# Patient Record
Sex: Male | Born: 1985 | Race: Black or African American | Hispanic: No | Marital: Single | Smoking: Current every day smoker
Health system: Southern US, Community
[De-identification: ages and names within clinical notes are randomized; demographics above are authoritative.]

## PROBLEM LIST (undated history)

## (undated) DIAGNOSIS — D573 Sickle-cell trait: Secondary | ICD-10-CM

---

## 2008-11-05 ENCOUNTER — Emergency Department: Payer: Self-pay | Admitting: Emergency Medicine

## 2009-02-22 ENCOUNTER — Emergency Department: Payer: Self-pay | Admitting: Emergency Medicine

## 2009-03-24 ENCOUNTER — Emergency Department: Payer: Self-pay | Admitting: Unknown Physician Specialty

## 2015-03-10 ENCOUNTER — Emergency Department
Admission: EM | Admit: 2015-03-10 | Discharge: 2015-03-10 | Disposition: A | Discharge status: Home / Self Care | Payer: No Typology Code available for payment source | Attending: Emergency Medicine | Admitting: Emergency Medicine

## 2015-03-10 ENCOUNTER — Emergency Department: Payer: No Typology Code available for payment source

## 2015-03-10 DIAGNOSIS — S300XXA Contusion of lower back and pelvis, initial encounter: Secondary | ICD-10-CM | POA: Insufficient documentation

## 2015-03-10 DIAGNOSIS — Y9389 Activity, other specified: Secondary | ICD-10-CM | POA: Insufficient documentation

## 2015-03-10 DIAGNOSIS — S3992XA Unspecified injury of lower back, initial encounter: Secondary | ICD-10-CM

## 2015-03-10 DIAGNOSIS — Y92410 Unspecified street and highway as the place of occurrence of the external cause: Secondary | ICD-10-CM | POA: Insufficient documentation

## 2015-03-10 DIAGNOSIS — Y998 Other external cause status: Secondary | ICD-10-CM | POA: Insufficient documentation

## 2015-03-10 MED ORDER — HYDROCODONE-ACETAMINOPHEN 5-325 MG PO TABS: 1.0000 | ORAL_TABLET | ORAL | Status: DC | PRN

## 2015-03-10 MED ORDER — CYCLOBENZAPRINE HCL 10 MG PO TABS: 10.0000 mg | ORAL_TABLET | Freq: Three times a day (TID) | ORAL | Status: DC | PRN

## 2015-03-10 MED ORDER — KETOROLAC TROMETHAMINE 60 MG/2ML IM SOLN
60.0000 mg | Freq: Once | INTRAMUSCULAR | Status: AC
  Administered 2015-03-10: 60 mg via INTRAMUSCULAR
  Filled 2015-03-10: qty 2

## 2015-03-10 MED ORDER — IBUPROFEN 800 MG PO TABS: 800.0000 mg | ORAL_TABLET | Freq: Three times a day (TID) | ORAL | Status: DC | PRN
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
## 2015-03-10 NOTE — Discharge Instructions (Signed)
Iliac Crest Contusion An iliac crest contusion is a deep bruise of your hip bone (hip pointer). Contusions are the result of an injury that caused bleeding under the skin. The contusion may turn blue, purple, or yellow. Minor injuries will give you a painless contusion, but more severe contusions may stay painful and swollen for a few weeks.  CAUSES  An iliac crest contusion is usually caused by a blow to the top of your hip pointer. The injury most often comes from contact sports.  SYMPTOMS   Swelling and redness of the hip area.  Bruising of the hip area.  Tenderness or soreness of the hip. DIAGNOSIS  The diagnosis can be made by taking your history and doing a physical exam. You may need an X-ray of your hip pointer to look for a broken bone (fracture). TREATMENT  Often, the best treatment for an iliac crest contusion is resting, icing, elevating, and applying cold compresses to the injured area. Over-the-counter medicines may also be recommended for pain control. Crutches may be used if walking is very painful. Some people need physical therapy to help with range of motion and strengthening.  HOME CARE INSTRUCTIONS   Put ice on the injured area.  Put ice in a plastic bag.  Place a towel between your skin and the bag.  Leave the ice on for 15-20 minutes, 03-04 times a day.  Only take over-the-counter or prescription medicines for pain, discomfort, or fever as directed by your caregiver. Your caregiver may recommend avoiding anti-inflammatory medicines (aspirin, ibuprofen, and naproxen) for 48 hours because these medicines may increase bruising.  Keep your leg straightened (extended) when possible.  Walk or move around as the pain allows, or as directed by your caregiver. Use crutches if your caregiver recommends them.  Apply compression wraps as directed by your caregiver. You may remove the wraps for sleeping, showers, and baths. SEEK IMMEDIATE MEDICAL CARE IF:   You have  increased bruising or swelling.  You have pain that is getting worse.  Your swelling or pain is not relieved with medicines.  Your toes get cold. MAKE SURE YOU:   Understand these instructions.  Will watch your condition.  Will get help right away if you are not doing well or get worse.   This information is not intended to replace advice given to you by your health care provider. Make sure you discuss any questions you have with your health care provider.   Document Released: 01/18/2001 Document Revised: 07/18/2011 Document Reviewed: 09/10/2014 Elsevier Interactive Patient Education 2016 [employer]  Contusion A contusion is a deep bruise. Contusions are the result of a blunt injury to tissues and muscle fibers under the skin. The injury causes bleeding under the skin. The skin overlying the contusion may turn blue, purple, or yellow. Minor injuries will give you a painless contusion, but more severe contusions may stay painful and swollen for a few weeks.  CAUSES  This condition is usually caused by a blow, trauma, or direct force to an area of the body. SYMPTOMS  Symptoms of this condition include:  Swelling of the injured area.  Pain and tenderness in the injured area.  Discoloration. The area may have redness and then turn blue, purple, or yellow. DIAGNOSIS  This condition is diagnosed based on a physical exam and medical history. An X-ray, CT scan, or MRI may be needed to determine if there are any associated injuries, such as broken bones (fractures). TREATMENT  Specific treatment for this condition  depends on what area of the body was injured. In general, the best treatment for a contusion is resting, icing, applying pressure to (compression), and elevating the injured area. This is often called the RICE strategy. Over-the-counter anti-inflammatory medicines may also be recommended for pain control.  HOME CARE INSTRUCTIONS   Rest the injured area.  If directed,  apply ice to the injured area:  Put ice in a plastic bag.  Place a towel between your skin and the bag.  Leave the ice on for 20 minutes, 2-3 times per day.  If directed, apply light compression to the injured area using an elastic bandage. Make sure the bandage is not wrapped too tightly. Remove and reapply the bandage as directed by your health care provider.  If possible, raise (elevate) the injured area above the level of your heart while you are sitting or lying down.  Take over-the-counter and prescription medicines only as told by your health care provider. SEEK MEDICAL CARE IF:  Your symptoms do not improve after several days of treatment.  Your symptoms get worse.  You have difficulty moving the injured area. SEEK IMMEDIATE MEDICAL CARE IF:   You have severe pain.  You have numbness in a hand or foot.  Your hand or foot turns pale or cold.   This information is not intended to replace advice given to you by your health care provider. Make sure you discuss any questions you have with your health care provider.   Document Released: 02/02/2005 Document Revised: 01/14/2015 Document Reviewed: 09/10/2014 Elsevier Interactive Patient Education [employer] Else[employer] Inc[employer]
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
## 2020-03-01 ENCOUNTER — Other Ambulatory Visit: Payer: Self-pay

## 2020-03-01 ENCOUNTER — Emergency Department: Payer: Self-pay

## 2020-03-01 ENCOUNTER — Encounter: Payer: Self-pay | Admitting: Emergency Medicine

## 2020-03-01 ENCOUNTER — Emergency Department
Admission: EM | Admit: 2020-03-01 | Discharge: 2020-03-01 | Disposition: A | Discharge status: Home / Self Care | Payer: Self-pay | Attending: Emergency Medicine | Admitting: Emergency Medicine

## 2020-03-01 DIAGNOSIS — M79671 Pain in right foot: Secondary | ICD-10-CM | POA: Insufficient documentation

## 2020-03-01 MED ORDER — NAPROXEN 500 MG PO TABS: 500.0000 mg | ORAL_TABLET | Freq: Two times a day (BID) | ORAL | 0 refills | Status: DC

## 2020-03-01 MED ORDER — HYDROCODONE-ACETAMINOPHEN 5-325 MG PO TABS: 1.0000 | ORAL_TABLET | Freq: Four times a day (QID) | ORAL | 0 refills | Status: DC | PRN

## 2020-03-01 MED ORDER — KETOROLAC TROMETHAMINE 30 MG/ML IJ SOLN
30.0000 mg | Freq: Once | INTRAMUSCULAR | Status: AC
  Administered 2020-03-01: 30 mg via INTRAMUSCULAR
  Filled 2020-03-01: qty 1

## 2020-03-01 MED ORDER — CEPHALEXIN 500 MG PO CAPS: 500.0000 mg | ORAL_CAPSULE | Freq: Three times a day (TID) | ORAL | 0 refills | Status: DC

## 2020-03-01 NOTE — ED Provider Notes (Signed)
[facility] Emergency Department Provider Note   ____________________________________________   First MD Initiated Contact with Patient 03/01/20 [phone]     (approximate)  I have reviewed the triage vital signs and the nursing notes.   HISTORY  Chief Complaint Foot Pain    HPI Dalton Hernandez is a 34 y.o. male presents to the ED with complaint of right foot pain. Patient states that he woke Saturday with pain on the dorsal aspect of his right foot without known history of injury. Patient states that he works at [employer] and stands most of the day. He reports that he wears supportive shoes. He denies any previous foot injury. He has not taken any over-the-counter medication for his foot. He rates his pain as 10/10.      History reviewed. No pertinent past medical history.  There are no problems to display for this patient.   History reviewed. No pertinent surgical history.  Prior to Admission medications   Medication Sig Start Date End Date Taking? Authorizing Provider  cephALEXin (KEFLEX) 500 MG capsule Take 1 capsule (500 mg total) by mouth 3 (three) times daily. 03/01/20   Tommi Rumps, PA-C  HYDROcodone-acetaminophen (NORCO/VICODIN) 5-325 MG tablet Take 1 tablet by mouth every 6 (six) hours as needed for moderate pain. 03/01/20   Tommi Rumps, PA-C  naproxen (NAPROSYN) 500 MG tablet Take 1 tablet (500 mg total) by mouth 2 (two) times daily with a meal. 03/01/20   Tommi Rumps, PA-C    Allergies Patient has no known allergies.  No family history on file.  Social History Social History   Tobacco Use  . Smoking status: Never Smoker  Substance Use Topics  . Alcohol use: No  . Drug use: Not on file    Review of Systems Constitutional: No fever/chills Eyes: No visual changes. Cardiovascular: Denies chest pain. Respiratory: Denies shortness of breath. Gastrointestinal:   No nausea, no vomiting.   Musculoskeletal:  Positive for right foot pain. Skin: Negative for rash. Neurological: Negative for headaches, focal weakness or numbness. ____________________________________________   PHYSICAL EXAM:  VITAL SIGNS: ED Triage Vitals  Enc Vitals Group     BP 03/01/20 0840 129/88     Pulse Rate 03/01/20 0840 60     Resp 03/01/20 0840 17     Temp 03/01/20 0840 99 F (37.2 C)     Temp Source 03/01/20 0840 Oral     SpO2 03/01/20 0840 97 %     Weight 03/01/20 0837 225 lb (102.1 kg)     Height 03/01/20 0837 6\' 3"  (1.905 m)     Head Circumference --      Peak Flow --      Pain Score 03/01/20 0837 10     Pain Loc --      Pain Edu? --      Excl. in GC? --     Constitutional: Alert and oriented. Well appearing and in no acute distress. Eyes: Conjunctivae are normal.  Head: Atraumatic. Neck: No stridor.   Cardiovascular: Normal rate, regular rhythm. Grossly normal heart sounds.  Good peripheral circulation. Respiratory: Normal respiratory effort.  No retractions. Lungs CTAB. Musculoskeletal: Examination of the right foot there is no gross deformity.  Patient has what looks to be an erythematous area measuring approximately 4 cm in diameter.  This area is not warm to touch but is markedly tender and patient is unable to tolerate palpation.  Skin is intact.  Pulses present both DP and  TP.  Motor sensory function intact.  There is no tenderness on palpation of the bilateral malleolus. Neurologic:  Normal speech and language. No gross focal neurologic deficits are appreciated.  Skin:  Skin is warm, dry and intact.  Psychiatric: Mood and affect are normal. Speech and behavior are normal.  ____________________________________________   LABS (all labs ordered are listed, but only abnormal results are displayed)  Labs Reviewed - No data to display   RADIOLOGY I, Tommi Rumps, personally viewed and evaluated these images (plain radiographs) as part of my medical decision making, as well as reviewing the  written report by the radiologist.   Official radiology report(s): DG Foot Complete Right  Result Date: 03/01/2020 CLINICAL DATA:  RIGHT foot pain for 3 days. EXAM: RIGHT FOOT COMPLETE - 3+ VIEW COMPARISON:  None. FINDINGS: Osseous alignment is normal. Bone mineralization is normal. No fracture line or displaced fracture fragment. No acute appearing cortical irregularity or osseous lesion. No degenerative change. No erosions, periarticular osteopenia or other signs of an inflammatory arthritis. Adjacent soft tissues are unremarkable. IMPRESSION: Negative. Electronically Signed   By: Bary Richard M.D.   On: 03/01/2020 09:27    ____________________________________________   PROCEDURES  Procedure(s) performed (including Critical Care):  Procedures   ____________________________________________   INITIAL IMPRESSION / ASSESSMENT AND PLAN / ED COURSE  As part of my medical decision making, I reviewed the following data within the electronic MEDICAL RECORD NUMBER Notes from prior ED visits and [place] Controlled Substance Database  34 year old male presents to the ED with complaint of right foot pain without history of injury.  Patient states that he stands a lot at work and this was sudden onset when he woke up yesterday morning.  Patient does not take any over-the-counter medication.  X-ray was negative for any acute bony injury.  There is a questionable early cellulitis versus ecchymosis on the dorsal aspect of his right foot measuring approximately 4 cm in diameter.  Skin is intact.  Patient was given Toradol 30 mg IM while in the ED.  A prescription for Keflex 500 mg, naproxen 500 mg and hydrocodone was sent to the pharmacy.  Patient is aware that he needs to elevate his foot today and continue with medications.  He is to return to the emergency department if any severe worsening of his symptoms or urgent concerns.  A note was written for him to stay out of work the next 2  days.  ____________________________________________   FINAL CLINICAL IMPRESSION(S) / ED DIAGNOSES  Final diagnoses:  Foot pain, right     ED Discharge Orders         Ordered    cephALEXin (KEFLEX) 500 MG capsule  3 times daily        03/01/20 1019    naproxen (NAPROSYN) 500 MG tablet  2 times daily with meals        03/01/20 1019    HYDROcodone-acetaminophen (NORCO/VICODIN) 5-325 MG tablet  Every 6 hours PRN        03/01/20 1019          *Please note:  Dalton Hernandez was evaluated in Emergency Department on 03/01/2020 for the symptoms described in the history of present illness. He was evaluated in the context of the global COVID-19 pandemic, which necessitated consideration that the patient might be at risk for infection with the SARS-CoV-2 virus that causes COVID-19. Institutional protocols and algorithms that pertain to the evaluation of patients at risk for COVID-19 are in a state  of rapid change based on information released by regulatory bodies including the CDC and federal and state organizations. These policies and algorithms were followed during the patient's care in the ED.  Some ED evaluations and interventions may be delayed as a result of limited staffing during and the pandemic.*   Note:  This document was prepared using Dragon voice recognition software and may include unintentional dictation errors.    Tommi Rumps, PA-C 03/01/20 1137    Minna Antis, MD 03/01/20 [phone]

## 2020-03-01 NOTE — Discharge Instructions (Signed)
Follow up with [facility] acute care or Dr. Alberteen Spindle if any continued problems.  Elevate your foot Begin taking your antibiotics, naprosyn and Norco if needed for pain.  Out of work note written.  Return to ED if any severe worsening of your foot such as fever, chills or change in temperature of your foot

## 2020-03-01 NOTE — ED Notes (Signed)
Pt presents to the ED for L foot pain and swelling. Denies injury pt states that he working 60hours a week. Pt is A&Ox4 and NAD. Ambulatory with a cane. Pt requesting ace bandages for the foot.

## 2020-03-01 NOTE — ED Triage Notes (Signed)
Pt reports woke up Saturday and he had pain in his right foot. Pt denies injuries but reports that the pain is on the top of his foot.

## 2020-05-23 ENCOUNTER — Other Ambulatory Visit: Payer: Self-pay

## 2020-09-04 ENCOUNTER — Other Ambulatory Visit: Payer: Self-pay

## 2020-09-04 ENCOUNTER — Encounter: Payer: Self-pay | Admitting: Emergency Medicine

## 2020-09-04 ENCOUNTER — Emergency Department: Payer: Self-pay

## 2020-09-04 ENCOUNTER — Emergency Department
Admission: EM | Admit: 2020-09-04 | Discharge: 2020-09-04 | Disposition: A | Discharge status: Home / Self Care | Payer: Self-pay | Attending: Emergency Medicine | Admitting: Emergency Medicine

## 2020-09-04 DIAGNOSIS — M19072 Primary osteoarthritis, left ankle and foot: Secondary | ICD-10-CM | POA: Insufficient documentation

## 2020-09-04 DIAGNOSIS — E790 Hyperuricemia without signs of inflammatory arthritis and tophaceous disease: Secondary | ICD-10-CM | POA: Insufficient documentation

## 2020-09-04 DIAGNOSIS — M79671 Pain in right foot: Secondary | ICD-10-CM | POA: Insufficient documentation

## 2020-09-04 DIAGNOSIS — F17290 Nicotine dependence, other tobacco product, uncomplicated: Secondary | ICD-10-CM | POA: Insufficient documentation

## 2020-09-04 HISTORY — DX: Sickle-cell trait: D57.3

## 2020-09-04 LAB — BASIC METABOLIC PANEL
Anion gap: 10 (ref 5–15)
BUN: 14 mg/dL (ref 6–20)
CO2: 24 mmol/L (ref 22–32)
Calcium: 9.5 mg/dL (ref 8.9–10.3)
Chloride: 103 mmol/L (ref 98–111)
Creatinine, Ser: 1.03 mg/dL (ref 0.61–1.24)
GFR, Estimated: >60 mL/min (ref >60)
Glucose, Bld: 103 mg/dL — ABNORMAL HIGH (ref 70–99)
Potassium: 4.1 mmol/L (ref 3.5–5.1)
Sodium: 137 mmol/L (ref 135–145)

## 2020-09-04 LAB — CBC WITH DIFFERENTIAL/PLATELET
Abs Immature Granulocytes: 0.02 10*3/uL (ref 0.00–0.07)
Basophils Absolute: 0 10*3/uL (ref 0.0–0.1)
Basophils Relative: 1 %
Eosinophils Absolute: 0.3 10*3/uL (ref 0.0–0.5)
Eosinophils Relative: 3 %
HCT: 42.4 % (ref 39.0–52.0)
Hemoglobin: 15 g/dL (ref 13.0–17.0)
Immature Granulocytes: 0 %
Lymphocytes Relative: 23 %
Lymphs Abs: 1.8 10*3/uL (ref 0.7–4.0)
MCH: 31.6 pg (ref 26.0–34.0)
MCHC: 35.4 g/dL (ref 30.0–36.0)
MCV: 89.3 fL (ref 80.0–100.0)
Monocytes Absolute: 0.8 10*3/uL (ref 0.1–1.0)
Monocytes Relative: 10 %
Neutro Abs: 5.1 10*3/uL (ref 1.7–7.7)
Neutrophils Relative %: 63 %
Platelets: 237 10*3/uL (ref 150–400)
RBC: 4.75 MIL/uL (ref 4.22–5.81)
RDW: 11.7 % (ref 11.5–15.5)
WBC: 7.9 10*3/uL (ref 4.0–10.5)
nRBC: 0 % (ref 0.0–0.2)

## 2020-09-04 LAB — URIC ACID: Uric Acid, Serum: 9 mg/dL — ABNORMAL HIGH (ref 3.7–8.6)

## 2020-09-04 NOTE — ED Provider Notes (Signed)
[facility] Emergency Department Provider Note   ____________________________________________   Event Date/Time   First MD Initiated Contact with Patient 09/04/20 [phone]     (approximate)  I have reviewed the triage vital signs and the nursing notes.   HISTORY  Chief Complaint Foot Pain   HPI Dalton Hernandez is a 35 y.o. male presents to the ED with complaint of bilateral foot pain.  Patient states that he thinks he may have gout.  He denies any recent injury to either foot.  His pain to the left foot is more on the lateral aspect.  He states pain started yesterday and he has taken over-the-counter medication without any relief.  He was seen for similar symptoms last month with his right foot.       Past Medical History:  Diagnosis Date  . Sickle cell trait (HCC)     There are no problems to display for this patient.   History reviewed. No pertinent surgical history.  Prior to Admission medications   Not on File    Allergies Patient has no known allergies.  No family history on file.  Social History Social History   Tobacco Use  . Smoking status: Current Every Day Smoker    Types: Cigars  . Smokeless tobacco: Never Used  Substance Use Topics  . Alcohol use: Yes  . Drug use: Not Currently    Review of Systems Constitutional: No fever/chills Eyes: No visual changes. Cardiovascular: Denies chest pain. Respiratory: Denies shortness of breath. Gastrointestinal: No abdominal pain.  No nausea, no vomiting.  Genitourinary: Negative for dysuria. Musculoskeletal: Positive for bilateral foot pain.  Negative for back pain. Skin: Negative for rash. Neurological: Negative for headaches, focal weakness or numbness.  ____________________________________________   PHYSICAL EXAM:  VITAL SIGNS: ED Triage Vitals  Enc Vitals Group     BP 09/04/20 0917 (!) 134/94     Pulse Rate 09/04/20 0914 78     Resp 09/04/20 0914 16     Temp 09/04/20  0914 98.5 F (36.9 C)     Temp Source 09/04/20 0914 Oral     SpO2 09/04/20 0914 97 %     Weight 09/04/20 0911 216 lb (98 kg)     Height 09/04/20 0911 6\' 3"  (1.905 m)     Head Circumference --      Peak Flow --      Pain Score 09/04/20 0911 7     Pain Loc --      Pain Edu? --      Excl. in GC? --    Constitutional: Alert and oriented. Well appearing and in no acute distress. Eyes: Conjunctivae are normal.  Head: Atraumatic. Neck: No stridor.   Cardiovascular: Normal rate, regular rhythm. Grossly normal heart sounds.  Good peripheral circulation. Respiratory: Normal respiratory effort.  No retractions. Lungs CTAB. Gastrointestinal: Soft and nontender. No distention.  Musculoskeletal: Positive for bilateral foot pain with right ankle pain. Neurologic:  Normal speech and language. No gross focal neurologic deficits are appreciated.  Skin:  Skin is warm, dry and intact.  No erythema or warmth is noted. Psychiatric: Mood and affect are normal. Speech and behavior are normal.  ____________________________________________   LABS (all labs ordered are listed, but only abnormal results are displayed)  Labs Reviewed  BASIC METABOLIC PANEL - Abnormal; Notable for the following components:      Result Value   Glucose, Bld 103 (*)    All other components within normal limits  URIC  ACID - Abnormal; Notable for the following components:   Uric Acid, Serum 9.0 (*)    All other components within normal limits  CBC WITH DIFFERENTIAL/PLATELET   ____________________________________________   RADIOLOGY Beaulah Corin, personally viewed and evaluated these images (plain radiographs) as part of my medical decision making, as well as reviewing the written report by the radiologist.    Official radiology report(s): DG Foot Complete Left  Result Date: 09/04/2020 CLINICAL DATA:  35 year old male with bilateral foot pain. History of sickle cell trait. No known injury. EXAM: LEFT FOOT -  COMPLETE 3+ VIEW COMPARISON:  None. FINDINGS: Normal background bone mineralization. Accessory ossicle adjacent to the cuboid. Chronic degenerative or posttraumatic small chronic ossicles along the dorsal midfoot, including at the anterior navicular articulation. There is subchondral sclerosis at the 1st TMT joint with mild spurring. Other joint spaces and alignment are normal. No acute osseous abnormality identified. No discrete soft tissue abnormality. IMPRESSION: 1. Degeneration at the 1st TMT joint.  Favor osteoarthritis. 2. Small chronic posttraumatic and/or degenerative ossific fragments at the anterior talus, dorsal navicular. 3.  No acute osseous abnormality identified. Electronically Signed   By: Odessa Fleming M.D.   On: 09/04/2020 10:23   DG Foot Complete Right  Result Date: 09/04/2020 CLINICAL DATA:  Bilateral foot pain EXAM: RIGHT FOOT COMPLETE - 3+ VIEW COMPARISON:  None. FINDINGS: No fracture or dislocation is seen. The joint spaces are preserved. Visualized soft tissues are within normal limits. IMPRESSION: Negative. Electronically Signed   By: Charline Bills M.D.   On: 09/04/2020 10:26    ____________________________________________   PROCEDURES  Procedure(s) performed (including Critical Care):  Procedures   ____________________________________________   INITIAL IMPRESSION / ASSESSMENT AND PLAN / ED COURSE  As part of my medical decision making, I reviewed the following data within the electronic MEDICAL RECORD NUMBER Notes from prior ED visits and [place] Controlled Substance Database  35 year old male presents to the ED with complaint of bilateral foot pain that started yesterday and he has taken over-the-counter medication without any relief.  There is been no recent injury.  Patient was here last month with similar issues with his right foot.  Today his uric acid is elevated at 9.  He was given a sheet describing a low purine diet and strongly encouraged [facility].  A prescription for meloxicam was sent to the pharmacy and a prescription for #10 hydrocodone prn pain.    ____________________________________________   FINAL CLINICAL IMPRESSION(S) / ED DIAGNOSES  Final diagnoses:  Osteoarthritis of left foot, unspecified osteoarthritis type  Bilateral foot pain  Elevated blood uric acid level     ED Discharge Orders    None      *Please note:  Rye L Shannahan was evaluated in Emergency Department on 09/04/2020 for the symptoms described in the history of present illness. He was evaluated in the context of the global COVID-19 pandemic, which necessitated consideration that the patient might be at risk for infection with the SARS-CoV-2 virus that causes COVID-19. Institutional protocols and algorithms that pertain to the evaluation of patients at risk for COVID-19 are in a state of rapid change based on information released by regulatory bodies including the CDC and federal and state organizations. These policies and algorithms were followed during the patient's care in the ED.  Some ED evaluations and interventions may be delayed as a result of limited staffing during and the pandemic.*   Note:  This document was prepared using  [employer]agon [employer] and may include unintentional dictation errors.    Tommi Rumps, PA-C 09/04/20 1402    Concha Se, MD 09/05/20 [phone]

## 2020-09-04 NOTE — ED Notes (Signed)
See triage note  Presents with pain to right ankle area with some swelling  And also having some pain to lateral aspect of left foot  Denies any injury

## 2020-09-04 NOTE — Discharge Instructions (Signed)
Follow-up with [facility] and try obtaining a primary care provider for continued management.  A prescription for meloxicam was sent to the pharmacy.  This medication is once a day with food.  Avoid alcohol which may cause a another flareup this weekend.  A list of low purine foods is added to your discharge papers which should decrease your chances of continued gout problems.

## 2020-09-04 NOTE — ED Triage Notes (Signed)
Pt to ED via POV c/o bilateral foot pain. Pt states that he thinks that he may have gout. Pt states that the pain started yesterday. Pt has taken OTC medication without relief.

## 2020-09-05 ENCOUNTER — Emergency Department
Admission: EM | Admit: 2020-09-05 | Discharge: 2020-09-05 | Disposition: A | Discharge status: Home / Self Care | Payer: Self-pay | Attending: Emergency Medicine | Admitting: Emergency Medicine

## 2020-09-05 ENCOUNTER — Other Ambulatory Visit: Payer: Self-pay

## 2020-09-05 DIAGNOSIS — F17290 Nicotine dependence, other tobacco product, uncomplicated: Secondary | ICD-10-CM | POA: Insufficient documentation

## 2020-09-05 DIAGNOSIS — E790 Hyperuricemia without signs of inflammatory arthritis and tophaceous disease: Secondary | ICD-10-CM | POA: Insufficient documentation

## 2020-09-05 DIAGNOSIS — M19172 Post-traumatic osteoarthritis, left ankle and foot: Secondary | ICD-10-CM | POA: Insufficient documentation

## 2020-09-05 DIAGNOSIS — M79671 Pain in right foot: Secondary | ICD-10-CM | POA: Insufficient documentation

## 2020-09-05 MED ORDER — MELOXICAM 7.5 MG PO TABS: 7.5000 mg | ORAL_TABLET | Freq: Every day | ORAL | 0 refills | Status: DC

## 2020-09-05 NOTE — ED Triage Notes (Signed)
Pt to ER via POV with bilateral foot pain. Pt was seen yesterday for same complaint. Pt was diagnosed with arthritis in both feet. Reports that he was unable to work today, work advised him to be seen in the ER again to "put him out of work".

## 2020-09-05 NOTE — Discharge Instructions (Signed)
You were seen today for bilateral foot pain.  There was no indication to repeat any of your x-rays or labs at this time.  I have given you the prescription for meloxicam to take daily to help reduce pain and inflammation in the feet.  I have provided you with a work note for 2 days.  Please follow-up with the [facility] if your symptoms persist or worsen.

## 2020-09-05 NOTE — ED Provider Notes (Signed)
[facility] Emergency Department Provider Note ____________________________________________  Time seen: 1410  I have reviewed the triage vital signs and the nursing notes.  HISTORY  Chief Complaint  Foot Pain   HPI Dalton Hernandez is a 35 y.o. male presents to the ER today with complaint of bilateral foot pain, left > right.  He reports this started yesterday.  He describes the pain as sharp and intense.  The pain does not radiate.  He denies swelling, redness, numbness, tingling or weakness.  He denies any injury to the area.  He was seen yesterday for the same.  X-ray of the left foot showed:  IMPRESSION: 1. Degeneration at the 1st TMT joint.  Favor osteoarthritis. 2. Small chronic posttraumatic and/or degenerative ossific fragments at the anterior talus, dorsal navicular. 3.  No acute osseous abnormality identified.  X-ray of the right foot showed:  IMPRESSION: Negative.  Uric acid level was elevated at 9. He was given a handout on a low purine diet.  The ER note stated that Meloxicam and Hydrocodone would be sent to the pharmacy but reports this was never sent to the pharmacy.  Upon review of epic, no prescriptions were placed at the ER visit from yesterday.  He also requested a work note that was also not given to him yesterday.   He reports the pain in his feet are unchanged.  He reports he tried to go to work today and when he put on his nonskid shoes, he felt a sharp pain in his left foot which caused him to fall.  He denies any injury with the fall.  He reports work sent him home and advised him to return to the ER to "take me out of work".   He would like his prescription sent to the pharmacy as well as his work note today.   Past Medical History:  Diagnosis Date  . Sickle cell trait (HCC)     There are no problems to display for this patient.   History reviewed. No pertinent surgical history.  Prior to Admission medications   Medication  Sig Start Date End Date Taking? Authorizing Provider  meloxicam (MOBIC) 7.5 MG tablet Take 1 tablet (7.5 mg total) by mouth daily. 09/05/20 09/05/21 Yes BaitySalvadore Oxford, NP    Allergies Patient has no known allergies.  No family history on file.  Social History Social History   Tobacco Use  . Smoking status: Current Every Day Smoker    Types: Cigars  . Smokeless tobacco: Never Used  Substance Use Topics  . Alcohol use: Yes  . Drug use: Not Currently    Review of Systems  Constitutional: Negative for fever, chills or body aches. Cardiovascular: Negative for chest pain or chest tightness. Respiratory: Negative for cough or shortness of breath. Musculoskeletal: Positive for bilateral foot pain.  Negative for joint swelling, knee pain. Skin: Negative for redness or warmth of the feet. Neurological: Negative for focal weakness, tingling or numbness. ____________________________________________  PHYSICAL EXAM:  VITAL SIGNS: ED Triage Vitals  Enc Vitals Group     BP 09/05/20 1249 (!) 153/97     Pulse Rate 09/05/20 1249 (!) 58     Resp 09/05/20 1249 16     Temp 09/05/20 1249 98.7 F (37.1 C)     Temp Source 09/05/20 1249 Oral     SpO2 09/05/20 1249 96 %     Weight 09/05/20 1250 216 lb (98 kg)     Height 09/05/20 1250 6\' 3"  (1.905  m)     Head Circumference --      Peak Flow --      Pain Score 09/05/20 1250 10     Pain Loc --      Pain Edu? --      Excl. in GC? --     Constitutional: Alert and oriented. Well appearing and in no distress. Head: Normocephalic. Cardiovascular: Normal rate, regular rhythm.  Pedal pulses 2+ bilaterally Respiratory: Normal respiratory effort. No wheezes/rales/rhonchi. Musculoskeletal: Normal flexion, extension and rotation of bilateral ankles.  Pain with palpation over the lateral fifth metatarsal but no deformity noted.  No swelling noted of the feet. Neurologic:  Normal gait without ataxia.  Sensation intact to BLE.  No gross focal  neurologic deficits are appreciated. Skin:  Skin is warm, dry and intact. No rash, redness or warmth noted.  INITIAL IMPRESSION / ASSESSMENT AND PLAN / ED COURSE  Bilateral Foot Pain:  DDx include gout vs arthritis No indication for additional lab or imaging work-up at this time Rx for Meloxicam 7.5 mg p.o. daily sent to pharmacy Advised him that I would not send in Hydrocodone for him Encourage low purine diet Work note provided ____________________________________________  FINAL CLINICAL IMPRESSION(S) / ED DIAGNOSES  Final diagnoses:  Post-traumatic osteoarthritis of left foot  Acute foot pain, right  Elevated blood uric acid level      Lorre Munroe, NP 09/05/20 1424    Concha Se, MD 09/07/20 1402

## 2020-09-10 ENCOUNTER — Telehealth: Payer: Self-pay

## 2020-09-10 NOTE — Telephone Encounter (Signed)
After receiving an ER referral, called pt to discuss being seen here. I explained the application process and pt is interested in filling out an application. Mailed one to his address.   MD 09/10/20 @ 2:31 pm

## 2021-01-25 ENCOUNTER — Emergency Department: Payer: Self-pay

## 2021-01-25 ENCOUNTER — Emergency Department
Admission: EM | Admit: 2021-01-25 | Discharge: 2021-01-25 | Disposition: A | Discharge status: Home / Self Care | Payer: Self-pay | Attending: Emergency Medicine | Admitting: Emergency Medicine

## 2021-01-25 ENCOUNTER — Other Ambulatory Visit: Payer: Self-pay

## 2021-01-25 ENCOUNTER — Encounter: Payer: Self-pay | Admitting: Emergency Medicine

## 2021-01-25 DIAGNOSIS — X501XXA Overexertion from prolonged static or awkward postures, initial encounter: Secondary | ICD-10-CM | POA: Insufficient documentation

## 2021-01-25 DIAGNOSIS — F17290 Nicotine dependence, other tobacco product, uncomplicated: Secondary | ICD-10-CM | POA: Insufficient documentation

## 2021-01-25 DIAGNOSIS — S93401A Sprain of unspecified ligament of right ankle, initial encounter: Secondary | ICD-10-CM | POA: Insufficient documentation

## 2021-01-25 MED ORDER — NAPROXEN 500 MG PO TABS: 500.0000 mg | ORAL_TABLET | Freq: Two times a day (BID) | ORAL | 2 refills | Status: DC

## 2021-01-25 NOTE — ED Provider Notes (Signed)
[facility] Emergency Department Provider Note   ____________________________________________    I have reviewed the triage vital signs and the nursing notes.   HISTORY  Chief Complaint Ankle Pain     HPI Dalton Hernandez is a 35 y.o. male who presents with complaints of right ankle pain.  Patient reports that he twisted his ankle about 5 days ago, he has been able to ambulate although with a limp.  He has taken some over-the-counter medications.  He reports symptoms are gradually improving but he is concerned that he is not back to normal yet, is here for x-ray  Past Medical History:  Diagnosis Date   Sickle cell trait (HCC)     There are no problems to display for this patient.   History reviewed. No pertinent surgical history.  Prior to Admission medications   Medication Sig Start Date End Date Taking? Authorizing Provider  naproxen (NAPROSYN) 500 MG tablet Take 1 tablet (500 mg total) by mouth 2 (two) times daily with a meal. 01/25/21  Yes Jene Every, MD     Allergies Patient has no known allergies.  No family history on file.  Social History Social History   Tobacco Use   Smoking status: Every Day    Types: Cigars   Smokeless tobacco: Never  Substance Use Topics   Alcohol use: Yes   Drug use: Not Currently    Review of Systems  Constitutional: No fever/chills   Musculoskeletal: As above Skin: Negative for rash. Neurological: Negative for headaches     ____________________________________________   PHYSICAL EXAM:  VITAL SIGNS: ED Triage Vitals  Enc Vitals Group     BP 01/25/21 1208 (!) 137/91     Pulse Rate 01/25/21 1208 (!) 57     Resp 01/25/21 1208 20     Temp 01/25/21 1208 98.8 F (37.1 C)     Temp Source 01/25/21 1208 Oral     SpO2 01/25/21 1208 99 %     Weight 01/25/21 1209 94.3 kg (208 lb)     Height 01/25/21 1209 1.892 m (6' 2.5")     Head Circumference --      Peak Flow --      Pain Score  01/25/21 1209 6     Pain Loc --      Pain Edu? --      Excl. in GC? --      Constitutional: Alert and oriented. No acute distress. Pleasant and interactive Eyes: Conjunctivae are normal.  Head: Atraumatic. Nose: No congestion/rhinnorhea. Mouth/Throat: Mucous membranes are moist.   Cardiovascular: Normal rate, regular rhythm.  Respiratory: Normal respiratory effort.  No retractions. Genitourinary: deferred Musculoskeletal: Right ankle: Mild swelling along the medial malleolus, no bony normalities palpated, warm and well-perfused Neurologic:  Normal speech and language. No gross focal neurologic deficits are appreciated.   Skin:  Skin is warm, dry and intact. No rash noted.   ____________________________________________   LABS (all labs ordered are listed, but only abnormal results are displayed)  Labs Reviewed - No data to display ____________________________________________  EKG   ____________________________________________  RADIOLOGY  X-ray reviewed by me, no fracture ____________________________________________   PROCEDURES  Procedure(s) performed: No  Procedures   Critical Care performed: No ____________________________________________   INITIAL IMPRESSION / ASSESSMENT AND PLAN / ED COURSE  Pertinent labs & imaging results that were available during my care of the patient were reviewed by me and considered in my medical decision making (see chart for details).  X-ray negative for fracture, continue supportive care   ____________________________________________   FINAL CLINICAL IMPRESSION(S) / ED DIAGNOSES  Final diagnoses:  Sprain of right ankle, unspecified ligament, initial encounter      NEW MEDICATIONS STARTED DURING THIS VISIT:  Discharge Medication List as of 01/25/2021  1:44 PM     START taking these medications   Details  naproxen (NAPROSYN) 500 MG tablet Take 1 tablet (500 mg total) by mouth 2 (two) times daily with a meal.,  Starting Mon 01/25/2021, Normal         Note:  This document was prepared using Dragon voice recognition software and may include unintentional dictation errors.    Jene Every, MD 01/25/21 [phone]

## 2021-01-25 NOTE — ED Triage Notes (Signed)
Patient states he slipped on water at work on Wednesday, felt a pop. Patient ambulatory to triage, but limping.

## 2021-07-03 ENCOUNTER — Emergency Department
Admission: EM | Admit: 2021-07-03 | Discharge: 2021-07-03 | Disposition: A | Discharge status: Home / Self Care | Payer: Self-pay | Attending: Student in an Organized Health Care Education/Training Program | Admitting: Student in an Organized Health Care Education/Training Program

## 2021-07-03 ENCOUNTER — Other Ambulatory Visit: Payer: Self-pay

## 2021-07-03 ENCOUNTER — Emergency Department: Payer: Self-pay

## 2021-07-03 ENCOUNTER — Encounter: Payer: Self-pay | Admitting: Emergency Medicine

## 2021-07-03 DIAGNOSIS — F17210 Nicotine dependence, cigarettes, uncomplicated: Secondary | ICD-10-CM | POA: Insufficient documentation

## 2021-07-03 DIAGNOSIS — M79671 Pain in right foot: Secondary | ICD-10-CM

## 2021-07-03 DIAGNOSIS — M722 Plantar fascial fibromatosis: Secondary | ICD-10-CM | POA: Insufficient documentation

## 2021-07-03 MED ORDER — HYDROCODONE-ACETAMINOPHEN 5-325 MG PO TABS: 1.0000 | ORAL_TABLET | Freq: Four times a day (QID) | ORAL | 0 refills | Status: DC | PRN

## 2021-07-03 MED ORDER — PREDNISONE 10 MG PO TABS: ORAL_TABLET | ORAL | 0 refills | Status: DC

## 2021-07-03 MED ORDER — KETOROLAC TROMETHAMINE 30 MG/ML IJ SOLN
30.0000 mg | Freq: Once | INTRAMUSCULAR | Status: AC
  Administered 2021-07-03: 30 mg via INTRAMUSCULAR
  Filled 2021-07-03: qty 1

## 2021-07-03 NOTE — ED Provider Notes (Signed)
[facility] Provider Note    Event Date/Time   First MD Initiated Contact with Patient 07/03/21 1124     (approximate)   History   Foot Pain   HPI  Dalton Hernandez is a 36 y.o. male presents to the ED with complaint of right foot pain especially near his great toe.  Patient denies any injury.  He states that he woke this morning with his foot hurting.  Patient has a history of sickle cell trait and admits to smoking cigars.  Patient's significant other gave him 1600 mg of ibuprofen this morning prior to arrival to the ED.  Currently he rates his pain as a 10/10.      Physical Exam   Triage Vital Signs: ED Triage Vitals  Enc Vitals Group     BP 07/03/21 1110 (!) 136/93     Pulse Rate 07/03/21 1110 71     Resp 07/03/21 1110 18     Temp 07/03/21 1110 98.1 F (36.7 C)     Temp Source 07/03/21 1110 Oral     SpO2 07/03/21 1110 96 %     Weight 07/03/21 1111 215 lb (97.5 kg)     Height 07/03/21 1111 6' 2.5" (1.892 m)     Head Circumference --      Peak Flow --      Pain Score 07/03/21 1117 10     Pain Loc --      Pain Edu? --      Excl. in [place]? --     Most recent vital signs: Vitals:   07/03/21 1110  BP: (!) 136/93  Pulse: 71  Resp: 18  Temp: 98.1 F (36.7 C)  SpO2: 96%     General: Awake, no distress.  CV:  Good peripheral perfusion.  Heart regular rate and rhythm without murmur. Resp:  Normal effort.  Lungs are clear bilaterally. Abd:  No distention.  Other:  On examination of the right foot there is no gross deformity or evidence of recent injury.  Patient does have flat feet and is extremely tender in the arch area without erythema or warmth.  There is an area at the first MP joint that is warm without erythema and markedly tender to touch.  Patient is able to move digits without any difficulty and capillary refill is less than 3 seconds.   ED Results / Procedures / Treatments   Labs (all labs ordered are listed, but only abnormal  results are displayed) Labs Reviewed - No data to display   RADIOLOGY Right x-ray images were reviewed by myself and no acute bony abnormality is noted.  Radiology report was reviewed and noted to be negative.    PROCEDURES:  Critical Care performed:   Procedures   MEDICATIONS ORDERED IN ED: Medications  ketorolac (TORADOL) 30 MG/ML injection 30 mg (30 mg Intramuscular Given 07/03/21 1238)     IMPRESSION / MDM / ASSESSMENT AND PLAN / ED COURSE  I reviewed the triage vital signs and the nursing notes.   Differential diagnosis includes, but is not limited to, acute right foot pain, acute gout, planter fasciitis, plantar foot strain.   36 year old male presents to the ED with complaint of acute right foot pain this morning without history of injury.  Patient has taken ibuprofen 1600 mg prior to arrival to the ED which he states has not helped with his pain.  He denies any medical problems.  Patient was given Toradol 30 mg IM and x-rays  were obtained.  X-rays were reviewed by myself and radiology report is negative for any acute bony abnormality.  Patient was made aware.  We discussed his flatfeet and want plantars fasciitis with mean for him.  He is encouraged to obtain shoes with arch support or orthotics to insert into his shoes.  Patient was given a prescription for prednisone tapering dose in the event that this could be acute gouty arthritis and also help with his plantar fasciitis.  He was instructed not to take 1600 mg of ibuprofen and 1 dose again as this caused caused severe stomach upset.  A prescription for Norco was sent to the pharmacy for him to take as needed for moderate to severe pain.  He is encouraged to use ice and elevation today.  He is to follow-up with Dr. Earleen Newport who is the podiatrist on-call if any continued problems.       FINAL CLINICAL IMPRESSION(S) / ED DIAGNOSES   Final diagnoses:  Foot pain, right  Plantar fasciitis of right foot     Rx / DC  Orders   ED Discharge Orders          Ordered    predniSONE (DELTASONE) 10 MG tablet        07/03/21 1317    HYDROcodone-acetaminophen (NORCO/VICODIN) 5-325 MG tablet  Every 6 hours PRN        07/03/21 1317             Note:  This document was prepared using Dragon voice recognition software and may include unintentional dictation errors.   Johnn Hai, PA-C 07/03/21 1331    Merlyn Lot, MD 07/03/21 1332

## 2021-07-03 NOTE — ED Triage Notes (Signed)
Pt via POV from home. Pt c/o R foot pain near the great toe. Denies any injuries, pt states he woke up like this. Denies any bruising at this time. Pt is A&Ox4 and NAD

## 2021-07-03 NOTE — Discharge Instructions (Addendum)
Call and make an appoint with Dr. Loreta Ave if you continue to have problems with your foot.  You may use ice and elevation for your foot as needed for pain today.  Also obtain a shoe that has a very supportive insole or obtain the orthotics over-the-counter to add to your current shoes.  Medication was sent to your pharmacy.  The hydrocodone is only for severe pain if needed every 6 hours and the prednisone is a tapering dose over the next 6 days.  If you are taking ibuprofen never take more than 600 mg at 1 time with food and you may repeat this 3 times a day.

## 2021-07-03 NOTE — ED Notes (Signed)
Pt declined DC vitals at this time. Pt Aox4 and appears in stable condition.

## 2021-09-16 ENCOUNTER — Ambulatory Visit: Payer: Self-pay | Admitting: Nurse Practitioner

## 2021-09-16 ENCOUNTER — Encounter: Payer: Self-pay | Admitting: Nurse Practitioner

## 2021-09-16 DIAGNOSIS — Z202 Contact with and (suspected) exposure to infections with a predominantly sexual mode of transmission: Secondary | ICD-10-CM

## 2021-09-16 DIAGNOSIS — Z113 Encounter for screening for infections with a predominantly sexual mode of transmission: Secondary | ICD-10-CM

## 2021-09-16 LAB — HEPATITIS B SURFACE ANTIGEN

## 2021-09-16 LAB — GRAM STAIN

## 2021-09-16 LAB — HM HIV SCREENING LAB: HM HIV Screening: NEGATIVE

## 2021-09-16 LAB — HM HEPATITIS C SCREENING LAB: HM Hepatitis Screen: NEGATIVE

## 2021-09-16 MED ORDER — PENICILLIN G BENZATHINE 1200000 UNIT/2ML IM SUSY
1.2000 10*6.[IU] | PREFILLED_SYRINGE | Freq: Once | INTRAMUSCULAR | Status: AC
  Administered 2021-09-16: 1.2 10*6.[IU] via INTRAMUSCULAR

## 2021-09-16 NOTE — Progress Notes (Signed)
Pt here for STD screening and as a contact to Syphilis.  Gram stain results reviewed. ?Bicillin 2.4 MU given IM without any complications.  Condoms declined.  Berdie Ogren, RN ? ?

## 2021-09-16 NOTE — Progress Notes (Addendum)
[facility] Department STI clinic/screening visit  Subjective:  Dalton Hernandez is a 36 y.o. male being seen today for an STI screening visit. The patient reports they do not have symptoms.    Patient has the following medical conditions:  There are no problems to display for this patient.    Chief Complaint  Patient presents with   SEXUALLY TRANSMITTED DISEASE    Screening.  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   Does the patient or their partner desires a pregnancy in the next year? No  Screening for MPX risk: Does the patient have an unexplained rash? No Is the patient MSM? No Does the patient endorse multiple sex partners or anonymous sex partners? No Did the patient have close or sexual contact with a person diagnosed with MPX? No Has the patient traveled outside the [place] where MPX is endemic? No Is there a high clinical suspicion for MPX-- evidenced by one of the following No  -Unlikely to be chickenpox  -Lymphadenopathy  -Rash that present in same phase of evolution on any given body part   See flowsheet for further details and programmatic requirements.   Immunization History  Administered Date(s) Administered   Hepatitis B 01/30/1998, 03/06/1998, 08/14/1998   Tdap 06/30/2010     The following portions of the patient's history were reviewed and updated as appropriate: allergies, current medications, past medical history, past social history, past surgical history and problem list.  Objective:  There were no vitals filed for this visit.  Physical Exam Constitutional:      Appearance: Normal appearance.  HENT:     Head: Normocephalic.     Right Ear: External ear normal.     Left Ear: External ear normal.     Nose: Nose normal.     Mouth/Throat:     Lips: Pink.     Mouth: Mucous membranes are moist.     Comments: Poor dentition  Pulmonary:     Effort: Pulmonary effort  is normal.  Abdominal:     General: Abdomen is flat.     Palpations: Abdomen is soft.  Genitourinary:    Comments: Pubic area without nits, lice, hair loss, edema, erythema, lesions and inguinal adenopathy. Penis without rash, lesions and discharge at meatus. Testicles descended bilaterally,nt, no masses or edema.  Musculoskeletal:     Cervical back: Full passive range of motion without pain, normal range of motion and neck supple.  Skin:    General: Skin is warm and dry.  Neurological:     Mental Status: He is alert and oriented to person, place, and time.  Psychiatric:        Attention and Perception: Attention normal.        Mood and Affect: Mood normal.        Speech: Speech normal.        Behavior: Behavior is cooperative.      Assessment and Plan:  Dalton Hernandez is a 36 y.o. male presenting to the [facility] Department for STI screening  1. Screening examination for venereal disease -36 year old male in clinic today for STD screening. -Patient does not have STI symptoms Patient accepted all screenings including  oral and urethra GC and gram stain and bloodwork for HIV/RPR.  Patient meets criteria for HepB screening? Yes. Ordered? Yes Patient meets criteria for HepC screening? Yes. Ordered? Yes Recommended condom use with all  sex Discussed importance of condom use for STI prevent  Treat gram stain per standing order Discussed time line for State [facility] results and that patient will be called with positive results and encouraged patient to call if he had not heard in 2 weeks Recommended returning for continued or worsening symptoms.    - HIV/HCV [place] [facility] - Syphilis Serology, [place] [facility] - HBV Antigen/Antibody [facility] - Gram stain - Gonococcus culture - Gonococcus culture  2. 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  Please treat patient with Bicillin.   - penicillin g benzathine (BICILLIN LA) 1200000 UNIT/2ML injection 1.2 Million  Units - penicillin g benzathine (BICILLIN LA) 1200000 UNIT/2ML injection 1.2 Million Units (dose charted on 09/30/21 but was given on 09/16/21).     Return if symptoms worsen or fail to improve.    Glenna Fellows, FNP

## 2021-09-20 LAB — GONOCOCCUS CULTURE

## 2021-09-21 ENCOUNTER — Emergency Department
Admission: EM | Admit: 2021-09-21 | Discharge: 2021-09-21 | Disposition: A | Discharge status: Home / Self Care | Payer: Self-pay | Attending: Emergency Medicine | Admitting: Emergency Medicine

## 2021-09-21 ENCOUNTER — Other Ambulatory Visit: Payer: Self-pay

## 2021-09-21 DIAGNOSIS — M109 Gout, unspecified: Secondary | ICD-10-CM

## 2021-09-21 DIAGNOSIS — M10071 Idiopathic gout, right ankle and foot: Secondary | ICD-10-CM | POA: Insufficient documentation

## 2021-09-21 MED ORDER — KETOROLAC TROMETHAMINE 30 MG/ML IJ SOLN
30.0000 mg | Freq: Once | INTRAMUSCULAR | Status: AC
  Administered 2021-09-21: 30 mg via INTRAMUSCULAR
  Filled 2021-09-21: qty 1

## 2021-09-21 MED ORDER — COLCHICINE 0.6 MG PO TABS
0.6000 mg | ORAL_TABLET | Freq: Once | ORAL | Status: AC
  Administered 2021-09-21: 0.6 mg via ORAL
  Filled 2021-09-21: qty 1

## 2021-09-21 MED ORDER — PREDNISONE 10 MG PO TABS: ORAL_TABLET | ORAL | 0 refills | Status: AC

## 2021-09-21 MED ORDER — COLCHICINE 0.6 MG PO TABS
1.2000 mg | ORAL_TABLET | Freq: Once | ORAL | Status: AC
  Administered 2021-09-21: 1.2 mg via ORAL
  Filled 2021-09-21: qty 2

## 2021-09-21 NOTE — ED Triage Notes (Addendum)
Pt comes with c/o right foot pain. Pt states pain and swelling in toes and ankle. Pt states this started few days ago.  Pt states pain when trying to bear weight on it. ?

## 2021-09-21 NOTE — Discharge Instructions (Addendum)
-  Take the prednisone as prescribed. ? ?-Treat pain with Tylenol/ibuprofen as needed. ? ?-Follow-up with your primary care provider as needed for ongoing gout management. ? ?-Return to the emergency department anytime if you begin to experience any new or worsening symptoms. ?

## 2021-09-21 NOTE — ED Provider Notes (Signed)
[facility] Provider Note    Event Date/Time   First MD Initiated Contact with Patient 09/21/21 1205     (approximate)   History   Chief Complaint Foot Pain   HPI Dalton Hernandez is a 36 y.o. male, history of sickle cell trait, presents to the emergency department for evaluation of foot pain.  Patient states that he has been experiencing acute pain in his right foot, big toe, and ankle over the past 3 days.  He states that he frequently works on his feet.  He states he has had pain like this before, which has been treated as gout with prednisone and ibuprofen.  He states that these treatments helped at the time, though the pain is back.  Denies fever/chills, chest pain, shortness of breath, abdominal pain, flank pain, nausea/vomiting, headache, numbness/tingling in lower extremities, or rash/lesions.  History Limitations: No limitations.        Physical Exam  Triage Vital Signs: ED Triage Vitals  Enc Vitals Group     BP 09/21/21 1154 (!) 140/100     Pulse Rate 09/21/21 1154 94     Resp 09/21/21 1154 18     Temp 09/21/21 1154 98 F (36.7 C)     Temp src --      SpO2 09/21/21 1154 98 %     Weight 09/21/21 1209 210 lb 1.6 oz (95.3 kg)     Height 09/21/21 1209 6\' 2"  (1.88 m)     Head Circumference --      Peak Flow --      Pain Score --      Pain Loc --      Pain Edu? --      Excl. in GC? --     Most recent vital signs: Vitals:   09/21/21 1154  BP: (!) 140/100  Pulse: 94  Resp: 18  Temp: 98 F (36.7 C)  SpO2: 98%    General: Awake, NAD.  Skin: Warm, dry. No rashes or lesions.  Eyes: PERRL. Conjunctivae normal.  CV: Good peripheral perfusion.  Resp: Normal effort.  Abd: Soft, non-tender. No distention.  Neuro: At baseline. No gross neurological deficits.   Focused Exam: Notable erythema and swelling along the right big toe and MTP joint, as well as diffuse swelling around the right ankle.  No gross deformities.  Patient able to  ambulate on his own without assistance.  Pulse, motor, sensation intact distally.  Physical Exam    ED Results / Procedures / Treatments  Labs (all labs ordered are listed, but only abnormal results are displayed) Labs Reviewed - No data to display   EKG N/A.   RADIOLOGY  ED Provider Interpretation: N/A.  No results found.  PROCEDURES:  Critical Care performed: N/A.  Procedures    MEDICATIONS ORDERED IN ED: Medications  colchicine tablet 1.2 mg (1.2 mg Oral Given 09/21/21 1407)  ketorolac (TORADOL) 30 MG/ML injection 30 mg (30 mg Intramuscular Given 09/21/21 1408)  colchicine tablet 0.6 mg (0.6 mg Oral Given 09/21/21 1453)     IMPRESSION / MDM / ASSESSMENT AND PLAN / ED COURSE  I reviewed the triage vital signs and the nursing notes.                              Differential diagnosis includes, but is not limited to, gout flare, plantar fasciitis, ankle/foot sprain, cellulitis.   ED Course Patient appears well, vitals within  normal limits.  NAD.  Currently afebrile.  We will go ahead initiate treatment for suspected gout flare with IM ketorolac and 1.2 mg colchicine p.o.  Upon reexamination, patient states that his pain is improved.  We will going give 1 more dose of colchicine 0.6 mg.  Assessment/Plan Presentation consistent with gout flare.  Previous uric acid has been positive during similar episodes.  Upon further discussion, he states he has been eating a lot of seafood lately.  Low suspicion for infectious pathology given his history and physical exam.  Previous imaging studies within the past few months have been unremarkable.  Treated here with ketorolac and colchicine with positive results.  We will provide her with a prescription for prednisone as well.   Provided the patient with anticipatory guidance, return precautions, and educational material. Encouraged the patient to return to the emergency department at any time if they begin to experience any new  or worsening symptoms. Patient expressed understanding and agreed with the plan.       FINAL CLINICAL IMPRESSION(S) / ED DIAGNOSES   Final diagnoses:  Acute gout involving toe of right foot, unspecified cause     Rx / DC Orders   ED Discharge Orders          Ordered    predniSONE (DELTASONE) 10 MG tablet        09/21/21 1451             Note:  This document was prepared using Dragon voice recognition software and may include unintentional dictation errors.   Varney Daily, [place] 09/21/21 1607    Sharyn Creamer, MD 09/25/21 1535

## 2021-09-21 NOTE — ED Notes (Signed)
See triage note  presents with right foot pain  states pain started on Sunday   then he noticed swelling to left great toe and ankle  denies any injury  increased pain with ambulation ? ?

## 2021-09-30 MED ORDER — PENICILLIN G BENZATHINE 1200000 UNIT/2ML IM SUSY
1.2000 10*6.[IU] | PREFILLED_SYRINGE | Freq: Once | INTRAMUSCULAR | Status: AC
  Administered 2021-09-30: 1.2 10*6.[IU] via INTRAMUSCULAR

## 2021-09-30 NOTE — Addendum Note (Signed)
Addended by: Glenna Fellows on: 09/30/2021 09:29 AM   Modules accepted: Orders

## 2021-10-10 ENCOUNTER — Other Ambulatory Visit: Payer: Self-pay

## 2021-10-10 ENCOUNTER — Emergency Department: Payer: Self-pay

## 2021-10-10 ENCOUNTER — Emergency Department
Admission: EM | Admit: 2021-10-10 | Discharge: 2021-10-10 | Disposition: A | Discharge status: Home / Self Care | Payer: Self-pay | Attending: Emergency Medicine | Admitting: Emergency Medicine

## 2021-10-10 DIAGNOSIS — Y990 Civilian activity done for income or pay: Secondary | ICD-10-CM | POA: Insufficient documentation

## 2021-10-10 DIAGNOSIS — W1849XA Other slipping, tripping and stumbling without falling, initial encounter: Secondary | ICD-10-CM | POA: Insufficient documentation

## 2021-10-10 DIAGNOSIS — S86911A Strain of unspecified muscle(s) and tendon(s) at lower leg level, right leg, initial encounter: Secondary | ICD-10-CM | POA: Insufficient documentation

## 2021-10-10 DIAGNOSIS — S8391XA Sprain of unspecified site of right knee, initial encounter: Secondary | ICD-10-CM | POA: Insufficient documentation

## 2021-10-10 MED ORDER — MELOXICAM 15 MG PO TABS: 15.0000 mg | ORAL_TABLET | Freq: Every day | ORAL | 0 refills | Status: DC

## 2021-10-10 MED ORDER — KETOROLAC TROMETHAMINE 30 MG/ML IJ SOLN
30.0000 mg | Freq: Once | INTRAMUSCULAR | Status: AC
  Administered 2021-10-10: 30 mg via INTRAMUSCULAR
  Filled 2021-10-10: qty 1

## 2021-10-10 NOTE — ED Triage Notes (Signed)
Pt arrives with c/o right knee pain that started a few days. Per pt, it is hard to bear weight. Pt denies injury.

## 2021-10-10 NOTE — ED Provider Notes (Signed)
[facility] Provider Note  Patient Contact: 8:51 PM (approximate)   History   Knee Pain   HPI  Dalton Hernandez is a 36 y.o. male who presents the emergency department complaining of knee pain.  Patient states that he was at work when he slipped on some grease on the floor.  Patient is having pain along the distal quad just superior to the knee.  Patient states that moving and lifting the knee as well as weightbearing causes pain.  Patient states that he has had "lots of injuries" to his knees plan sports but does not appear that he sustained fractures or ligament rupture in the past with his right knee.  Patient is using a cane to ambulate which she states he does not typically need.  No other injury or complaint at this time.  No medications prior to arrival.     Physical Exam   Triage Vital Signs: ED Triage Vitals [10/10/21 2030]  Enc Vitals Group     BP (!) 151/98     Pulse Rate 96     Resp 16     Temp 98.6 F (37 C)     Temp Source Oral     SpO2 97 %     Weight 216 lb (98 kg)     Height      Head Circumference      Peak Flow      Pain Score      Pain Loc      Pain Edu?      Excl. in GC?     Most recent vital signs: Vitals:   10/10/21 2030  BP: (!) 151/98  Pulse: 96  Resp: 16  Temp: 98.6 F (37 C)  SpO2: 97%     General: Alert and in no acute distress.  Cardiovascular:  Good peripheral perfusion Respiratory: Normal respiratory effort without tachypnea or retractions. Lungs CTAB.  Musculoskeletal: Full range of motion to all extremities.  Visualization of the right knee reveals no obvious deformity.  Slight edema noted along the distal quad region.  No other significant edema, no open wounds.  Palpation reveals tenderness along the distal quad.  No other significant tenderness to palpation over the knee.  Slight ballottement in the suprapatellar space.  Varus, valgus, Lachman's and McMurray's is negative.  Dorsalis pedis pulse intact  distally. Neurologic:  No gross focal neurologic deficits are appreciated.  Skin:   No rash noted Other:   ED Results / Procedures / Treatments   Labs (all labs ordered are listed, but only abnormal results are displayed) Labs Reviewed - No data to display   EKG     RADIOLOGY  I personally viewed, evaluated, and interpreted these images as part of my medical decision making, as well as reviewing the written report by the radiologist.  ED Provider Interpretation: No acute traumatic finding to the right knee on x-ray  DG Knee Complete 4 Views Right  Result Date: 10/10/2021 CLINICAL DATA:  Atraumatic right knee pain. EXAM: RIGHT KNEE - COMPLETE 4+ VIEW COMPARISON:  None Available. FINDINGS: No evidence of fracture, dislocation, or joint effusion. No evidence of arthropathy. A benign-appearing sclerotic area is seen within the visualized portion of the mid right fibular shaft. Soft tissues are unremarkable. IMPRESSION: 1. No acute osseous abnormality. Electronically Signed   By: Aram Candela M.D.   On: 10/10/2021 21:22    PROCEDURES:  Critical Care performed: No  Procedures   MEDICATIONS ORDERED IN ED: Medications  ketorolac (TORADOL) 30 MG/ML injection 30 mg (has no administration in time range)     IMPRESSION / MDM / ASSESSMENT AND PLAN / ED COURSE  I reviewed the triage vital signs and the nursing notes.                              Differential diagnosis includes, but is not limited to, knee sprain, quadriceps tendon rupture, patellar dislocation, tibial plateau fracture   Patient's presentation is most consistent with acute illness / injury with system symptoms.   Patient's diagnosis is consistent with right knee sprain.  Patient presents the emergency department complaining of pain along the distal quad.  Patient slipped, felt a sharp sensation in his quad.  There is no evidence of quadriceps rupture on physical exam.  Given the mechanism of injury x-ray was  performed with no evidence of osseous trauma.  Patient will be given hinged knee brace and anti-inflammatories for symptom improvement..  Follow-up with orthopedics as needed.  Patient is given ED precautions to return to the ED for any worsening or new symptoms.        FINAL CLINICAL IMPRESSION(S) / ED DIAGNOSES   Final diagnoses:  Strain of right knee, initial encounter     Rx / DC Orders   ED Discharge Orders          Ordered    meloxicam (MOBIC) 15 MG tablet  Daily        10/10/21 2201             Note:  This document was prepared using Dragon voice recognition software and may include unintentional dictation errors.   Lanette Hampshire 10/10/21 2202    Georga Hacking, MD 10/11/21 [phone]

## 2021-10-10 NOTE — ED Notes (Signed)
Dc ppw provided to patient. questions, followup and rx information reviewed as needed. pt provides verbal consent for dc at this time. pt denies vs at time of dc. Pt alert and oriented to lobby in wheelchair at this time

## 2022-04-15 ENCOUNTER — Emergency Department: Payer: Self-pay

## 2022-04-15 ENCOUNTER — Emergency Department
Admission: EM | Admit: 2022-04-15 | Discharge: 2022-04-15 | Discharge status: Against Medical Advice | Payer: Self-pay | Attending: Emergency Medicine | Admitting: Emergency Medicine

## 2022-04-15 DIAGNOSIS — M79601 Pain in right arm: Secondary | ICD-10-CM | POA: Insufficient documentation

## 2022-04-15 DIAGNOSIS — Z5321 Procedure and treatment not carried out due to patient leaving prior to being seen by health care provider: Secondary | ICD-10-CM | POA: Insufficient documentation

## 2022-04-15 NOTE — ED Triage Notes (Addendum)
Pt reports Sunday he had a box fall on his rt arm at work and Monday he noted he was having pain with extending elbow.

## 2022-04-16 ENCOUNTER — Emergency Department
Admission: EM | Admit: 2022-04-16 | Discharge: 2022-04-16 | Disposition: A | Discharge status: Home / Self Care | Payer: Self-pay | Attending: Emergency Medicine | Admitting: Emergency Medicine

## 2022-04-16 ENCOUNTER — Other Ambulatory Visit: Payer: Self-pay

## 2022-04-16 ENCOUNTER — Emergency Department: Payer: Self-pay

## 2022-04-16 DIAGNOSIS — S52021A Displaced fracture of olecranon process without intraarticular extension of right ulna, initial encounter for closed fracture: Secondary | ICD-10-CM | POA: Insufficient documentation

## 2022-04-16 DIAGNOSIS — X58XXXA Exposure to other specified factors, initial encounter: Secondary | ICD-10-CM | POA: Insufficient documentation

## 2022-04-16 MED ORDER — HYDROCODONE-ACETAMINOPHEN 5-325 MG PO TABS: 1.0000 | ORAL_TABLET | Freq: Four times a day (QID) | ORAL | 0 refills | Status: AC | PRN

## 2022-04-16 MED ORDER — NAPROXEN 500 MG PO TABS: 500.0000 mg | ORAL_TABLET | Freq: Two times a day (BID) | ORAL | 0 refills | Status: AC

## 2022-04-16 MED ORDER — KETOROLAC TROMETHAMINE 60 MG/2ML IM SOLN
60.0000 mg | Freq: Once | INTRAMUSCULAR | Status: AC
  Administered 2022-04-16: 60 mg via INTRAMUSCULAR
  Filled 2022-04-16: qty 2

## 2022-04-16 MED ORDER — HYDROCODONE-ACETAMINOPHEN 5-325 MG PO TABS
2.0000 | ORAL_TABLET | Freq: Once | ORAL | Status: AC
  Administered 2022-04-16: 2 via ORAL
  Filled 2022-04-16: qty 2

## 2022-04-16 NOTE — Discharge Instructions (Signed)
For now:  Wear the splint at all times Call Ortho to set up a follow-up in the next 5-7 days No weightbearing with the arm at all  Take the Naproxen with meals twice a day for 7 days Take the hydrocodone for severe pain Elevate the arm as often as possible

## 2022-04-16 NOTE — ED Triage Notes (Signed)
Pt having pain in his rt elbow x4 days- pt denies injury, but for work he does carry a lot of boxes

## 2022-04-16 NOTE — ED Provider Notes (Signed)
[facility] Provider Note    Event Date/Time   First MD Initiated Contact with Patient 04/16/22 1108     (approximate)   History   Arm Pain   HPI  Dalton Hernandez is a 36 y.o. male  right hand dominant here with R shoulder pain. Pt states he fell asleep holding his newborn in his R arm. When he awoke, he had severe increased pain in his R elbow which has persisted since onset. The pain is severe, worse w/ any movement, and associated with R elbow swelling. No numbness or weakness. 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 No open wounds.       Physical Exam   Triage Vital Signs: ED Triage Vitals  Enc Vitals Group     BP 04/16/22 1009 (!) 148/95     Pulse Rate 04/16/22 1009 74     Resp 04/16/22 1009 18     Temp 04/16/22 1009 98.2 F (36.8 C)     Temp Source 04/16/22 1009 Oral     SpO2 04/16/22 1009 98 %     Weight --      Height --      Head Circumference --      Peak Flow --      Pain Score 04/16/22 1013 8     Pain Loc --      Pain Edu? --      Excl. in GC? --     Most recent vital signs: Vitals:   04/16/22 1009  BP: (!) 148/95  Pulse: 74  Resp: 18  Temp: 98.2 F (36.8 C)  SpO2: 98%     General: Awake, no distress.  CV:  Good peripheral perfusion.  Resp:  Normal effort.  Abd:  No distention.  Other:  Moderate TTP and swelling over the R elbow with tenderness upon pROM. No open wounds. Distal strength/sensation is fully intact.   ED Results / Procedures / Treatments   Labs (all labs ordered are listed, but only abnormal results are displayed) Labs Reviewed - No data to display   EKG    RADIOLOGY XR Elbow Right: Degen changes with effusion, possible coronoid process avulsion fx   I also independently reviewed and agree with radiologist interpretations.   PROCEDURES:  Critical Care performed: No   MEDICATIONS ORDERED IN ED: Medications  ketorolac (TORADOL) injection 60 mg (60 mg  Intramuscular Given 04/16/22 1132)  HYDROcodone-acetaminophen (NORCO/VICODIN) 5-325 MG per tablet 2 tablet (2 tablets Oral Given 04/16/22 1132)     IMPRESSION / MDM / ASSESSMENT AND PLAN / ED COURSE  I reviewed the triage vital signs and the nursing notes.                              Differential diagnosis includes, but is not limited to, elbow sprain/strain, fracture, gout/inflammatory arthritis, tendonitis.  Patient's presentation is most consistent with acute presentation with potential threat to life or bodily function.  36 yo RHD male here with significant R elbow pain. Suspect overuse injury but possibly complicated by stress/overuse fx based on imaging. DDx includes inflammatory arthritis. No fevers, no warmth, no risk factors or signs of septic arthritis. Plain films reviewed as above. Will place in splint given ? Fx on imaging and degree of discomfort, and pts use of R hand at work/main hand so will be cautious. Will refer to Ortho for  repeat exam and follow-up in 1 week. NWB until cleared.  FINAL CLINICAL IMPRESSION(S) / ED DIAGNOSES   Final diagnoses:  Closed fracture of olecranon process of right ulna, initial encounter     Rx / DC Orders   ED Discharge Orders          Ordered    naproxen (NAPROSYN) 500 MG tablet  2 times daily with meals        04/16/22 1130    HYDROcodone-acetaminophen (NORCO/VICODIN) 5-325 MG tablet  Every 6 hours PRN        04/16/22 1130             Note:  This document was prepared using Dragon voice recognition software and may include unintentional dictation errors.   Shaune Pollack, MD 04/16/22 1620

## 2022-04-16 NOTE — ED Provider Triage Note (Signed)
Emergency Medicine Provider Triage Evaluation Note  Dalton Hernandez , a 36 y.o. male RHD was evaluated in triage.  Pt complains of right elbow pain for the past 4 days. Works for [employer]. No trauma but reports repetitive movement. No fever  Review of Systems  Positive: Elbow pain Negative: fever  Physical Exam  BP (!) 148/95 (BP Location: Left Arm)   Pulse 74   Temp 98.2 F (36.8 C) (Oral)   Resp 18   SpO2 98%  Gen:   Awake, no distress   Resp:  Normal effort  MSK:   Limited ROM of right elbow, no warmth or erythema or wounds Other:    Medical Decision Making  Medically screening exam initiated at 10:11 AM.  Appropriate orders placed.  Alandis L Van was informed that the remainder of the evaluation will be completed by another provider, this initial triage assessment does not replace that evaluation, and the importance of remaining in the ED until their evaluation is complete.     Jackelyn Hoehn, PA-C 04/16/22 1013

## 2022-04-29 ENCOUNTER — Ambulatory Visit (INDEPENDENT_AMBULATORY_CARE_PROVIDER_SITE_OTHER): Payer: Self-pay

## 2022-04-29 ENCOUNTER — Ambulatory Visit (INDEPENDENT_AMBULATORY_CARE_PROVIDER_SITE_OTHER): Payer: Self-pay | Admitting: Orthopaedic Surgery

## 2022-04-29 DIAGNOSIS — S52044A Nondisplaced fracture of coronoid process of right ulna, initial encounter for closed fracture: Secondary | ICD-10-CM

## 2022-04-29 MED ORDER — PREDNISONE 10 MG (21) PO TBPK: ORAL_TABLET | ORAL | 3 refills | Status: AC

## 2022-04-29 MED ORDER — DICLOFENAC SODIUM 75 MG PO TBEC: 75.0000 mg | DELAYED_RELEASE_TABLET | Freq: Two times a day (BID) | ORAL | 2 refills | Status: AC

## 2022-04-29 NOTE — Progress Notes (Signed)
Office Visit Note   Patient: Dalton Hernandez           Date of Birth: 01-21-1986           MRN: [id number] Visit Date: 04/29/2022              Requested by: No referring provider defined for this encounter. PCP: Pcp, No   Assessment & Plan: Visit Diagnoses:  1. Nondisplaced fracture of coronoid process of right ulna, initial encounter for closed fracture     Plan: Impression is right elbow coronoid fracture.  I think a large part of his symptoms are due to prolonged immobilization and stiffness.  He has a fair amount of swelling as well.  I am going to discontinue his splint and send him to OT for range of motion.  I have written a prescription for prednisone Dosepak and diclofenac.  He is to use ice and heat and compression as needed.  Recheck in 4 weeks.  Follow-Up Instructions: Return in about 4 weeks (around 05/27/2022).   Orders:  Orders Placed This Encounter  Procedures   XR Elbow Complete Right (3+View)   Ambulatory referral to Occupational Therapy   Meds ordered this encounter  Medications   predniSONE (STERAPRED UNI-PAK 21 TAB) 10 MG (21) TBPK tablet    Sig: Take as directed    Dispense:  21 tablet    Refill:  3   diclofenac (VOLTAREN) 75 MG EC tablet    Sig: Take 1 tablet (75 mg total) by mouth 2 (two) times daily.    Dispense:  30 tablet    Refill:  2      Procedures: No procedures performed   Clinical Data: No additional findings.   Subjective: Chief Complaint  Patient presents with   Right Elbow - Fracture    HPI Dalton Hernandez is a 36 year old gentleman here as a ER follow-up from 04/16/2022 for right elbow injury.  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  He went to the ER and x-rays of showed an avulsion fracture of the coronoid.  He has been in a long-arm splint since the ninth.  He  feels muscle spasms and sharp pains from the elbow to the hand. Review of Systems  Constitutional: Negative.   HENT: Negative.    Eyes: Negative.   Respiratory: Negative.    Cardiovascular: Negative.   Gastrointestinal: Negative.   Endocrine: Negative.   Genitourinary: Negative.   Skin: Negative.   Allergic/Immunologic: Negative.   Neurological: Negative.   Hematological: Negative.   Psychiatric/Behavioral: Negative.    All other systems reviewed and are negative.    Objective: Vital Signs: There were no vitals taken for this visit.  Physical Exam Vitals and nursing note reviewed.  Constitutional:      Appearance: He is well-developed.  HENT:     Head: Normocephalic and atraumatic.  Eyes:     Pupils: Pupils are equal, round, and reactive to light.  Pulmonary:     Effort: Pulmonary effort is normal.  Abdominal:     Palpations: Abdomen is soft.  Musculoskeletal:        General: Normal range of motion.     Cervical back: Neck supple.  Skin:    General: Skin is warm.  Neurological:     Mental Status:  He is alert and oriented to person, place, and time.  Psychiatric:        Behavior: Behavior normal.        Thought Content: Thought content normal.        Judgment: Judgment normal.     Ortho Exam Examination of the right elbow shows moderate swelling.  He guards to flexion and extension but he has fluid painless pronation and supination.  He has global tenderness to the elbow.  He has good triceps and biceps function. Specialty Comments:  No specialty comments available.  Imaging: No results found.   PMFS History: There are no problems to display for this patient.  Past Medical History:  Diagnosis Date   Sickle cell trait (HCC)     No family history on file.  No past surgical history on file. Social History   Occupational History   Not on file  Tobacco Use   Smoking status: Every Day    Types: Cigars   Smokeless tobacco: Never  Vaping Use   Vaping  Use: Never used  Substance and Sexual Activity   Alcohol use: Yes    Alcohol/week: 3.0 standard drinks of alcohol    Types: 3 Cans of beer per week    Comment: daily   Drug use: Yes    Types: Marijuana    Comment: daily   Sexual activity: Yes    Birth control/protection: Condom

## 2022-10-29 IMAGING — DX DG KNEE COMPLETE 4+V*R*
4 series · 4 of 4 positions shown · non-contrast
Comparison: None Available.

CLINICAL DATA: Atraumatic right knee pain.

EXAM:
RIGHT KNEE - COMPLETE 4+ VIEW

[knee ap]
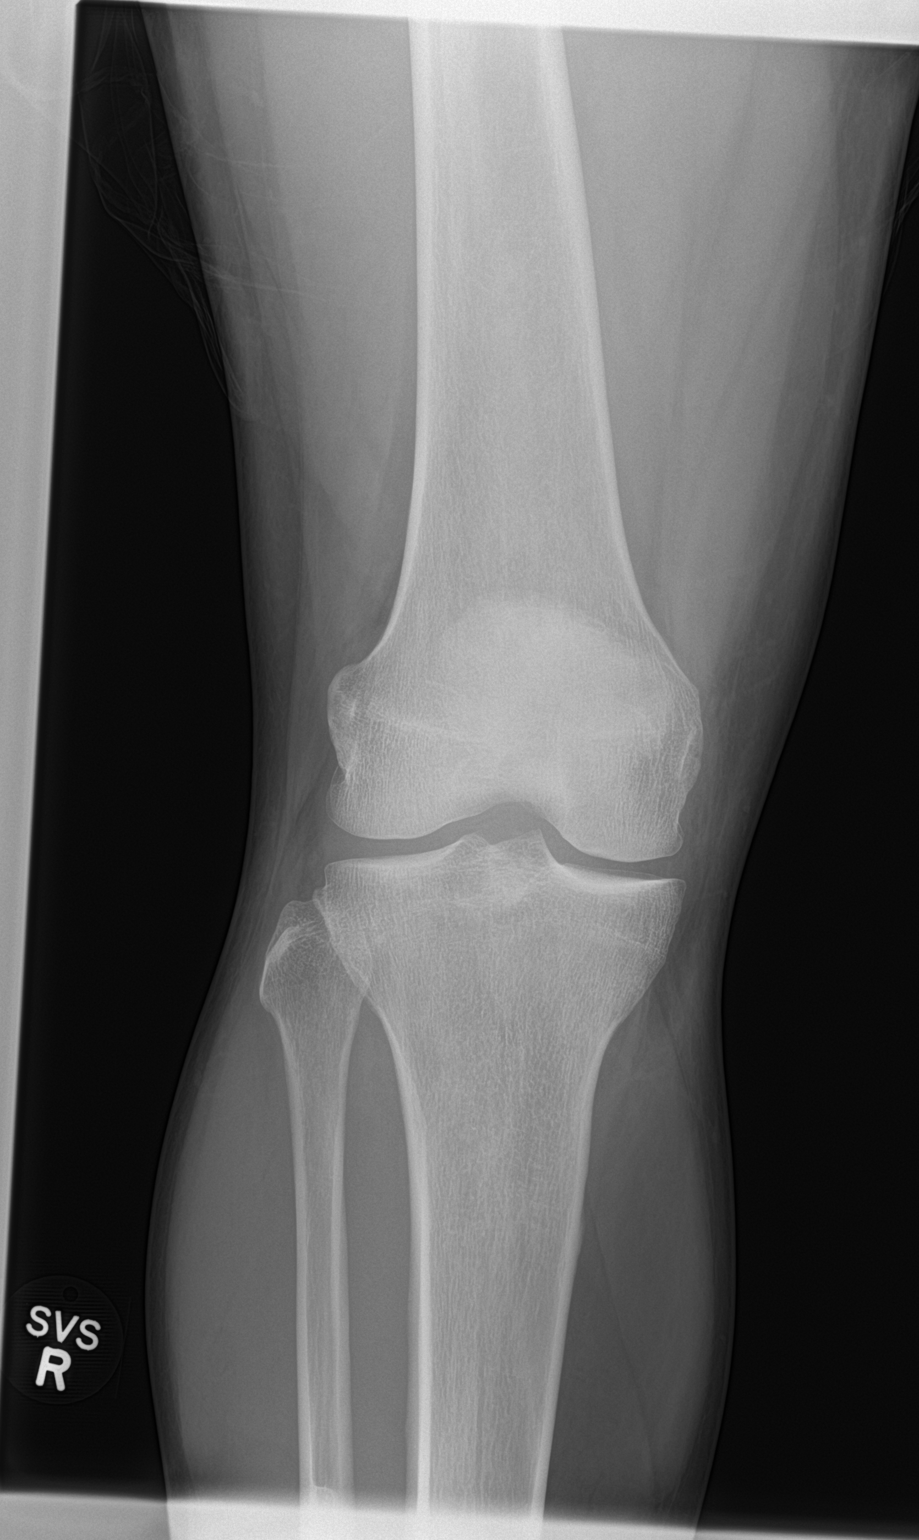

[knee tunnel]
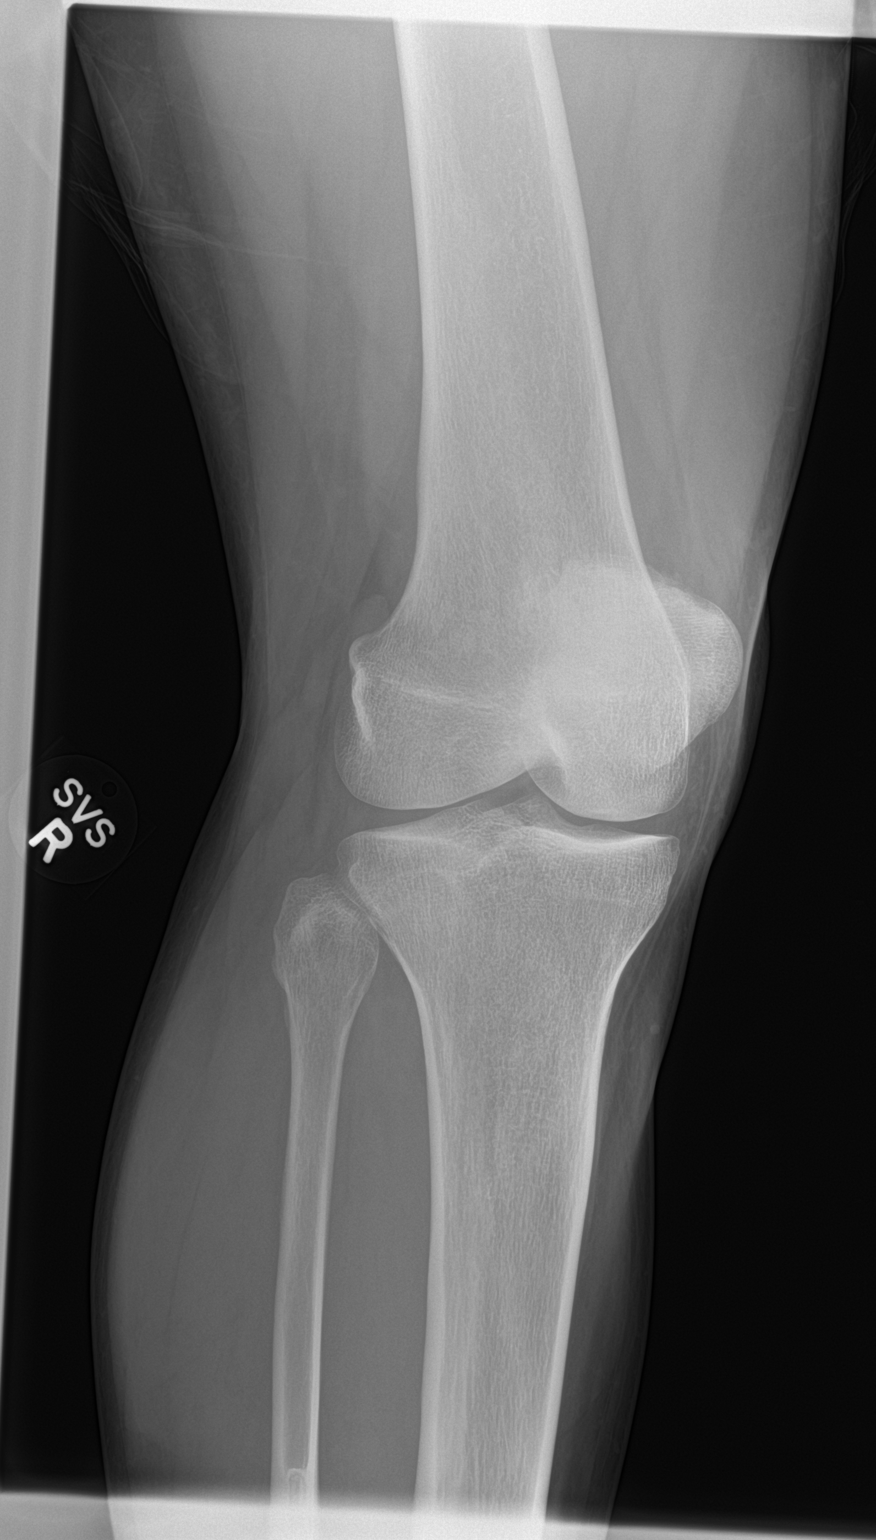

[knee lat]
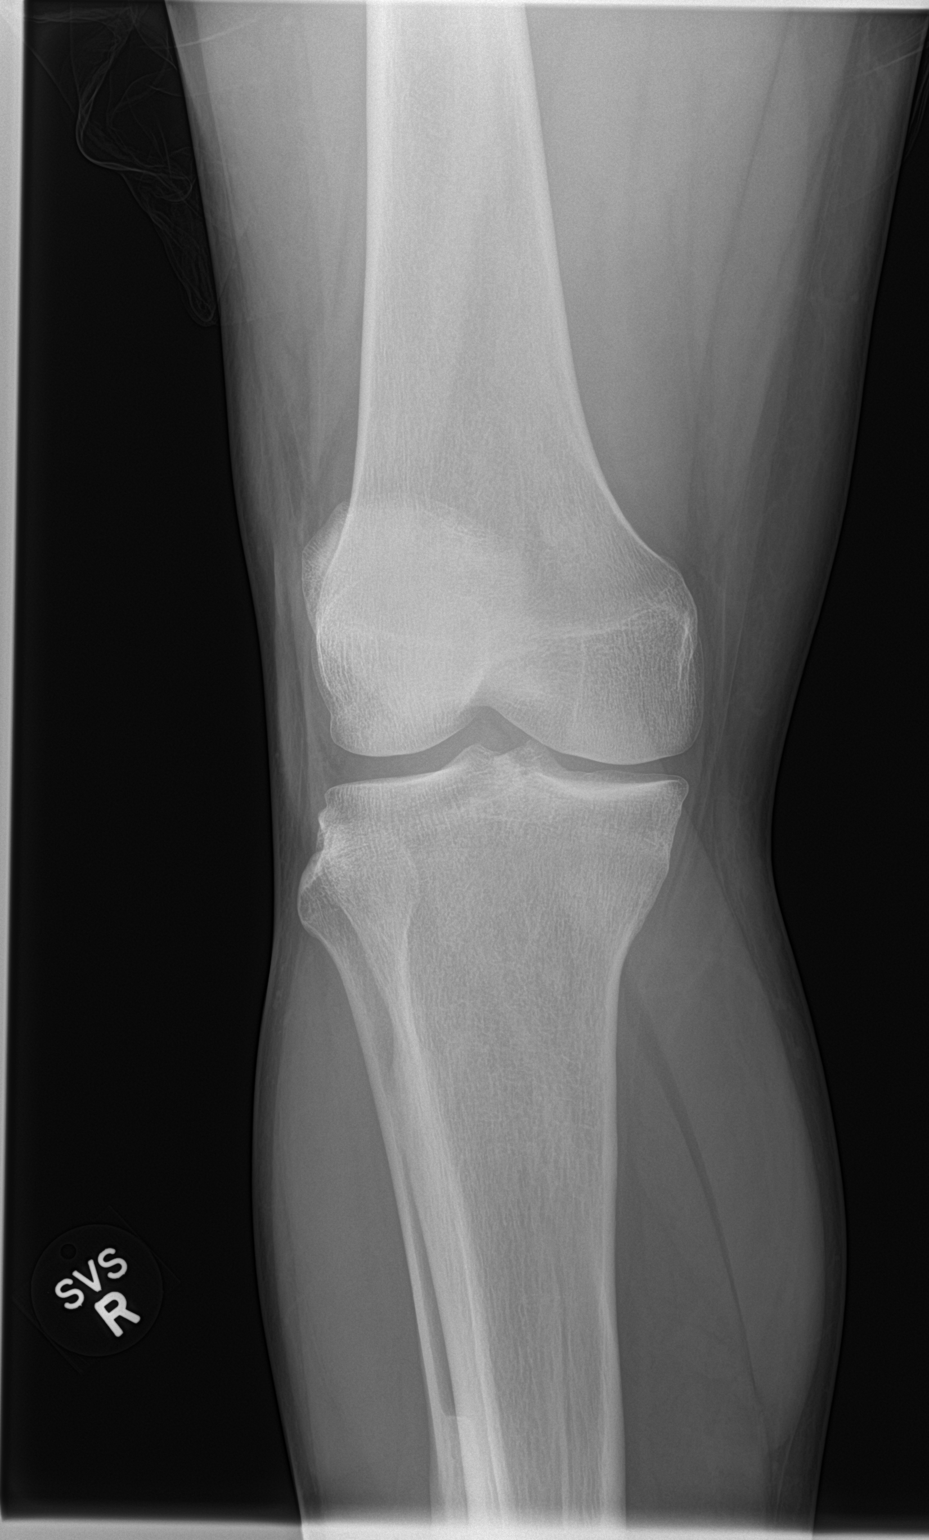

[knee obl]
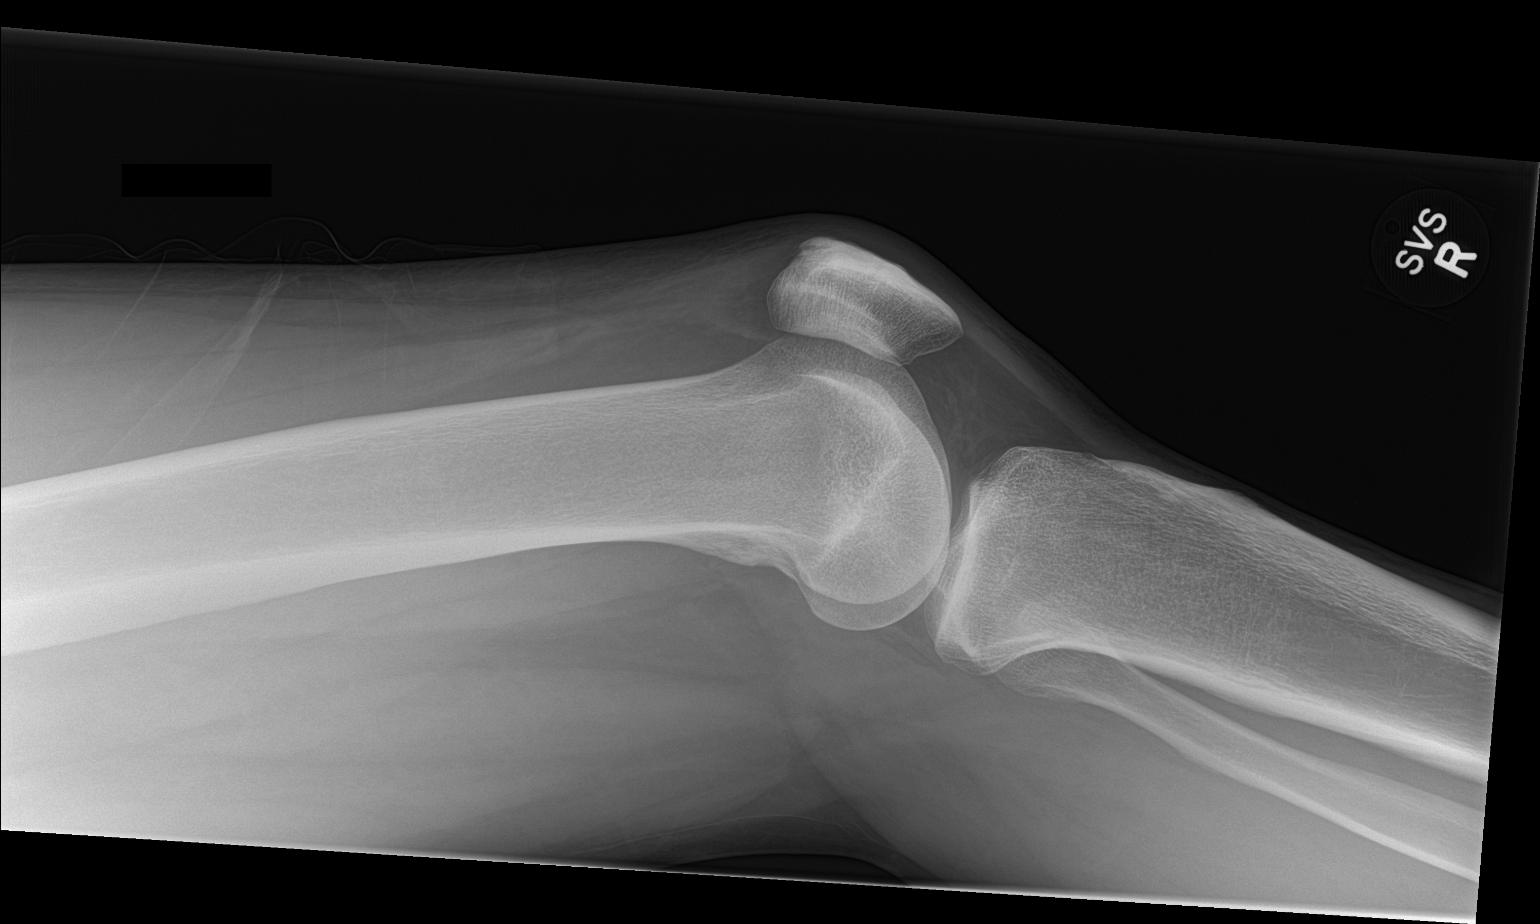

[4 of 4 positions shown; findings below may reference images not displayed]

FINDINGS: No evidence of fracture, dislocation, or joint effusion. No evidence
of arthropathy. A benign-appearing sclerotic area is seen within the
visualized portion of the mid right fibular shaft. Soft tissues are
unremarkable.
IMPRESSION: 1. No acute osseous abnormality.

## 2022-12-04 ENCOUNTER — Other Ambulatory Visit: Payer: Self-pay

## 2022-12-04 ENCOUNTER — Emergency Department
Admission: EM | Admit: 2022-12-04 | Discharge: 2022-12-04 | Disposition: A | Discharge status: Home / Self Care | Payer: Self-pay | Attending: Emergency Medicine | Admitting: Emergency Medicine

## 2022-12-04 DIAGNOSIS — U071 COVID-19: Secondary | ICD-10-CM | POA: Insufficient documentation

## 2022-12-04 LAB — SARS CORONAVIRUS 2 BY RT PCR: SARS Coronavirus 2 by RT PCR: POSITIVE — AB

## 2022-12-04 MED ORDER — BENZONATATE 100 MG PO CAPS: 100.0000 mg | ORAL_CAPSULE | Freq: Three times a day (TID) | ORAL | 0 refills | Status: AC | PRN

## 2022-12-04 MED ORDER — ONDANSETRON HCL 4 MG PO TABS: 4.0000 mg | ORAL_TABLET | Freq: Every day | ORAL | 1 refills | Status: AC | PRN

## 2022-12-04 NOTE — ED Triage Notes (Signed)
Pt states coming in with body aches, nausea, unable to taste food, vomiting and chills since yesterday. Pt states he has not been around anyone with covid that he is aware.

## 2022-12-04 NOTE — ED Provider Notes (Signed)
[facility] Emergency Department Provider Note     Event Date/Time   First MD Initiated Contact with Patient 12/04/22 1504     (approximate)   History   Generalized Body Aches   HPI  Dalton Hernandez is a 37 y.o. male presents to the ED for generalized bodyaches x 1 days.  Unknown sick contacts.  Patient reports ports associated symptoms including headache, loss of taste and cough.  Cough is dry.  Patient has been resting and drinking water for treatment.  Endorses chills and nausea.  No vomiting. Denies respiratory history.  Denies chest pain or shortness of breath.     Physical Exam   Triage Vital Signs: ED Triage Vitals [12/04/22 1408]  Encounter Vitals Group     BP (!) 163/111     Systolic BP Percentile      Diastolic BP Percentile      Pulse Rate 91     Resp 20     Temp 98.4 F (36.9 C)     Temp src      SpO2 97 %     Weight 240 lb (108.9 kg)     Height 6' 2.5" (1.892 m)     Head Circumference      Peak Flow      Pain Score 10     Pain Loc      Pain Education      Exclude from Growth Chart     Most recent vital signs: Vitals:   12/04/22 1408  BP: (!) 163/111  Pulse: 91  Resp: 20  Temp: 98.4 F (36.9 C)  SpO2: 97%   General: Alert and oriented. INAD.  Skin:  Warm, dry and intact. No rashes or lesions noted.     Head:  NCAT.  Eyes:  PERRLA. EOMI. Conjunctivae clear.  Nose:   Mucosa is moist. No rhinorrhea. Throat: Oropharynx clear. No erythema or exudates. Tonsils not enlarged.  CV:  Good peripheral perfusion. RRR.  RESP:  Normal effort. LCTAB.  No wheezing, rhonchi. no retractions or accessory muscle use.  ABD:  No distention.   ED Results / Procedures / Treatments   Labs (all labs ordered are listed, but only abnormal results are displayed) Labs Reviewed  SARS CORONAVIRUS 2 BY RT PCR - Abnormal; Notable for the following components:      Result Value   SARS Coronavirus 2 by RT PCR POSITIVE (*)    All other  components within normal limits   No results found.   PROCEDURES:  Critical Care performed: No  Procedures   MEDICATIONS ORDERED IN ED: Medications - No data to display   IMPRESSION / MDM / ASSESSMENT AND PLAN / ED COURSE  I reviewed the triage vital signs and the nursing notes.                              Clinical Course as of 12/04/22 1526  Sun Dec 04, 2022  1524 BP 152/103 taken at bedside.  [MH]    Clinical Course User Index [MH] Kern Reap A, PA-C   37 y.o. male presents to the emergency department for evaluation and treatment of acute generalized body aches and flulike symptoms. See HPI for further details.   Differential diagnosis includes, but is not limited to COVID, URI, viral illness, strep pharyngitis, pneumonia.  Given acute presentation  and normal lung exam, pneumonia is considered, but less likely clinically. COVID-19  PCR obtained in triage.  Positive.  Patient is provided with quarantine and isolation protocol via CDC.  BP at baseline trending down from arrival.  Given nontoxic appearance, patient will be discharged home with prescriptions for Zofran and Tessalon Perles.  ED precautions are provided for any worsening symptoms. Patient is to follow up with PCP. A list is provided.  All questions and concerns were addressed during ED visit.    Patient's presentation is most consistent with acute complicated illness / injury requiring diagnostic workup.  FINAL CLINICAL IMPRESSION(S) / ED DIAGNOSES   Final diagnoses:  COVID-19   Rx / DC Orders   ED Discharge Orders          Ordered    benzonatate (TESSALON PERLES) 100 MG capsule  3 times daily PRN        12/04/22 1517    ondansetron (ZOFRAN) 4 MG tablet  Daily PRN        12/04/22 1517             Note:  This document was prepared using Dragon voice recognition software and may include unintentional dictation errors.    Romeo Apple, Dessiree Sze A, PA-C 12/04/22 1533    Corena Herter,  MD 12/06/22 1332

## 2022-12-04 NOTE — Discharge Instructions (Signed)

## 2023-12-31 ENCOUNTER — Emergency Department
Admission: EM | Admit: 2023-12-31 | Discharge: 2023-12-31 | Disposition: A | Discharge status: Home / Self Care | Attending: Emergency Medicine | Admitting: Emergency Medicine

## 2023-12-31 ENCOUNTER — Other Ambulatory Visit: Payer: Self-pay

## 2023-12-31 ENCOUNTER — Emergency Department

## 2023-12-31 DIAGNOSIS — M25562 Pain in left knee: Secondary | ICD-10-CM | POA: Diagnosis present

## 2023-12-31 DIAGNOSIS — X501XXA Overexertion from prolonged static or awkward postures, initial encounter: Secondary | ICD-10-CM | POA: Diagnosis not present

## 2023-12-31 DIAGNOSIS — Y9367 Activity, basketball: Secondary | ICD-10-CM | POA: Insufficient documentation

## 2023-12-31 NOTE — Discharge Instructions (Addendum)
 You are seen in the emergency department for an injury to your left knee/lower thigh region.  Please follow-up with the orthopedist listed in this paperwork, you will have to give their office a call.  You can use Tylenol  at home for your pain based on the instructions on the bottle.  Please wear the knee immobilizer at all times.  You may take it off to shower.  Please use the crutches to remain nonweightbearing until you follow-up with the orthopedist.  You may return to the emergency department for any new, worsening, or concerning symptoms.

## 2023-12-31 NOTE — ED Provider Notes (Signed)
 [facility] Provider Note    Event Date/Time   First MD Initiated Contact with Patient 12/31/23 1633     (approximate)   History   left knee injury   HPI  Dalton Hernandez is a 38 y.o. male  with a past medical history of sickle cell trait presents to the emergency department with left knee injury after playing basketball yesterday.  Patient states he was shooting a layup when he came down onto his left knee and twisted it.  Patient has been using a cane since the incident and took 2 Tylenol  prior to coming to the ER today.  Patient denies numbness, tingling, any open wound, fever, chills, erythema or edema. Denies any other injuries at this time. Denies hip or ankle pain. No prior knee surgeries.    Physical Exam   Triage Vital Signs: ED Triage Vitals  Encounter Vitals Group     BP 12/31/23 1423 (!) 120/105     Girls Systolic BP Percentile --      Girls Diastolic BP Percentile --      Boys Systolic BP Percentile --      Boys Diastolic BP Percentile --      Pulse Rate 12/31/23 1423 77     Resp 12/31/23 1423 16     Temp 12/31/23 1423 (!) 97.5 F (36.4 C)     Temp Source 12/31/23 1423 Oral     SpO2 12/31/23 1423 100 %     Weight 12/31/23 1425 243 lb (110.2 kg)     Height 12/31/23 1425 6' 2 (1.88 m)     Head Circumference --      Peak Flow --      Pain Score 12/31/23 1424 5     Pain Loc --      Pain Education --      Exclude from Growth Chart --     Most recent vital signs: Vitals:   12/31/23 1423 12/31/23 1733  BP: (!) 120/105 (!) 120/98  Pulse: 77 74  Resp: 16 17  Temp: (!) 97.5 F (36.4 C) 97.7 F (36.5 C)  SpO2: 100% 100%    General: Awake, in no acute distress. Appears stated age. CV: Good peripheral perfusion. No edema.  Respiratory:Normal respiratory effort.  No respiratory distress.  GI: Soft, non-distended. MSK: Normal ROM in bilateral knees, slightly reduced flexion of left knee. Able to do full passive and active knee  extension. TTP along lower left thigh just above the patella with some crepitus noted with palpation. Negative Anterior Drawer. Crepitus noted with lateral and medial McMurray testing. No MCL or LCL laxity.  Skin:Warm, dry, intact. No rashes, lesions, or ecchymosis. No cyanosis or pallor. Neurological: A&Ox4. No focal deficits.   ED Results / Procedures / Treatments   Labs (all labs ordered are listed, but only abnormal results are displayed) Labs Reviewed - No data to display   EKG     RADIOLOGY X ray left knee IMPRESSION: 1. No acute fracture or dislocation. 2. Small quadriceps tendon enthesophyte.   PROCEDURES:  Critical Care performed: No   Procedures   MEDICATIONS ORDERED IN ED: Medications - No data to display   IMPRESSION / MDM / ASSESSMENT AND PLAN / ED COURSE  I reviewed the triage vital signs and the nursing notes.                              Differential diagnosis includes,  but is not limited to, knee contusion, quadriceps tendonitis, quadriceps tendon rupture, meniscal or ACL injury  Patient's presentation is most consistent with acute complicated illness / injury requiring diagnostic workup.  Patient is a 38 year old male presenting with left knee pain just proximal to his patella after basketball injury yesterday.  X-ray of the left knee ordered and shows no acute fracture or dislocation, there is presence of a small quadriceps tendon enesthophyte.  I independently viewed and interpreted the x-ray as well as the radiologist's report.  I agree with the radiologist's report.  Patient has full passive and active extension of the knee, slightly reduced flexion of the left knee due to pain compared to the right.  Suspect quadriceps tendinitis, knee contusion vs. ligamentous injury.  Will place him in a knee immobilizer with crutches, using OTC pain medication and have him follow-up with orthopedics in office.  The patient may return to the emergency department  for any new, worsening, or concerning symptoms. Patient was given the opportunity to ask questions; all questions were answered. Emergency department return precautions were discussed with the patient.  Patient is in agreement to the treatment plan.  Patient is stable for discharge.   FINAL CLINICAL IMPRESSION(S) / ED DIAGNOSES   Final diagnoses:  Acute pain of left knee     Rx / DC Orders   ED Discharge Orders     None        Note:  This document was prepared using Dragon voice recognition software and may include unintentional dictation errors.     Sheron Salm, PA-C 12/31/23 1837    Arlander Charleston, MD 12/31/23 [phone]

## 2023-12-31 NOTE — ED Triage Notes (Signed)
 Pt arrives via POV with c/o a left knee injury after playing basketball with their kids yesterday. Pt states that they twisted their knee going for a layup. Pt is A&Ox4 and using a cane in triage.
